# Patient Record
Sex: Female | Born: 1960 | Race: White | Hispanic: No | Marital: Single | Smoking: Former smoker
Health system: Southern US, Community
[De-identification: ages and names within clinical notes are randomized; demographics above are authoritative.]

## PROBLEM LIST (undated history)

## (undated) DIAGNOSIS — E119 Type 2 diabetes mellitus without complications: Secondary | ICD-10-CM

## (undated) DIAGNOSIS — G4730 Sleep apnea, unspecified: Secondary | ICD-10-CM

## (undated) DIAGNOSIS — T7840XA Allergy, unspecified, initial encounter: Secondary | ICD-10-CM

## (undated) DIAGNOSIS — I10 Essential (primary) hypertension: Secondary | ICD-10-CM

## (undated) DIAGNOSIS — E079 Disorder of thyroid, unspecified: Secondary | ICD-10-CM

## (undated) DIAGNOSIS — M5020 Other cervical disc displacement, unspecified cervical region: Secondary | ICD-10-CM

## (undated) DIAGNOSIS — K769 Liver disease, unspecified: Secondary | ICD-10-CM

## (undated) HISTORY — PX: LIVER BIOPSY: SHX301

## (undated) HISTORY — PX: CHOLECYSTECTOMY: SHX55

## (undated) HISTORY — DX: Allergy, unspecified, initial encounter: T78.40XA

## (undated) HISTORY — PX: ABLATION: SHX5711

---

## 2005-02-17 ENCOUNTER — Emergency Department: Payer: Self-pay | Admitting: Unknown Physician Specialty

## 2006-08-15 ENCOUNTER — Emergency Department: Payer: Self-pay | Admitting: General Practice

## 2006-08-31 ENCOUNTER — Emergency Department: Payer: Self-pay | Admitting: Emergency Medicine

## 2006-09-02 ENCOUNTER — Emergency Department: Payer: Self-pay | Admitting: Unknown Physician Specialty

## 2007-06-06 ENCOUNTER — Emergency Department: Payer: Self-pay

## 2008-01-02 ENCOUNTER — Observation Stay: Payer: Self-pay | Admitting: Surgery

## 2008-01-02 ENCOUNTER — Other Ambulatory Visit: Payer: Self-pay

## 2009-03-12 ENCOUNTER — Emergency Department: Payer: Self-pay | Admitting: Internal Medicine

## 2011-03-28 ENCOUNTER — Emergency Department: Payer: Self-pay | Admitting: Emergency Medicine

## 2011-04-15 ENCOUNTER — Ambulatory Visit: Payer: Self-pay | Admitting: Family Medicine

## 2011-09-12 ENCOUNTER — Emergency Department: Payer: Self-pay | Admitting: Emergency Medicine

## 2011-09-12 LAB — CBC WITH DIFFERENTIAL/PLATELET
Basophil #: 0 10*3/uL (ref 0.0–0.1)
Eosinophil %: 3.4 %
HGB: 10.6 g/dL — ABNORMAL LOW (ref 12.0–16.0)
Lymphocyte #: 1.5 10*3/uL (ref 1.0–3.6)
Lymphocyte %: 29.5 %
MCH: 24 pg — ABNORMAL LOW (ref 26.0–34.0)
MCV: 76 fL — ABNORMAL LOW (ref 80–100)
Monocyte #: 0.4 x10 3/mm (ref 0.2–0.9)
Monocyte %: 7.8 %
Neutrophil %: 58.9 %
Platelet: 306 10*3/uL (ref 150–440)
WBC: 5 10*3/uL (ref 3.6–11.0)

## 2011-09-18 ENCOUNTER — Emergency Department: Payer: Self-pay | Admitting: Emergency Medicine

## 2011-11-17 LAB — HEPATIC FUNCTION PANEL A (ARMC)
Bilirubin, Direct: <0.1 mg/dL (ref 0.00–0.20)
SGOT(AST): 31 U/L (ref 15–37)
Total Protein: 7.8 g/dL (ref 6.4–8.2)

## 2011-11-17 LAB — BASIC METABOLIC PANEL
Calcium, Total: 9 mg/dL (ref 8.5–10.1)
Chloride: 106 mmol/L (ref 98–107)
EGFR (Non-African Amer.): >60
Glucose: 85 mg/dL (ref 65–99)
Osmolality: 281 (ref 275–301)
Potassium: 3.7 mmol/L (ref 3.5–5.1)
Sodium: 142 mmol/L (ref 136–145)

## 2011-11-17 LAB — CK TOTAL AND CKMB (NOT AT ARMC): CK, Total: 140 U/L (ref 21–215)

## 2011-11-17 LAB — CBC
HCT: 37.9 % (ref 35.0–47.0)
HGB: 12 g/dL (ref 12.0–16.0)
MCH: 25.1 pg — ABNORMAL LOW (ref 26.0–34.0)
MCHC: 31.7 g/dL — ABNORMAL LOW (ref 32.0–36.0)
Platelet: 262 10*3/uL (ref 150–440)
RBC: 4.79 10*6/uL (ref 3.80–5.20)
WBC: 5.9 10*3/uL (ref 3.6–11.0)

## 2011-11-18 ENCOUNTER — Observation Stay: Payer: Self-pay | Admitting: Internal Medicine

## 2011-11-18 DIAGNOSIS — R079 Chest pain, unspecified: Secondary | ICD-10-CM

## 2011-11-18 LAB — IRON AND TIBC
Iron Bind.Cap.(Total): 330 ug/dL (ref 250–450)
Iron Saturation: 29 %
Iron: 97 ug/dL (ref 50–170)

## 2011-11-18 LAB — FERRITIN: Ferritin (ARMC): 49 ng/mL (ref 8–388)

## 2011-11-18 LAB — TROPONIN I
Troponin-I: <0.02 ng/mL
Troponin-I: <0.02 ng/mL

## 2011-11-23 LAB — PATHOLOGY REPORT

## 2011-12-13 ENCOUNTER — Emergency Department: Payer: Self-pay | Admitting: Emergency Medicine

## 2012-01-09 ENCOUNTER — Emergency Department: Payer: Self-pay | Admitting: Internal Medicine

## 2012-01-09 LAB — CBC
HGB: 12.8 g/dL (ref 12.0–16.0)
Platelet: 250 10*3/uL (ref 150–440)
RBC: 4.91 10*6/uL (ref 3.80–5.20)
WBC: 5.7 10*3/uL (ref 3.6–11.0)

## 2012-01-09 LAB — COMPREHENSIVE METABOLIC PANEL
Alkaline Phosphatase: 186 U/L — ABNORMAL HIGH (ref 50–136)
Anion Gap: 5 — ABNORMAL LOW (ref 7–16)
BUN: 8 mg/dL (ref 7–18)
Calcium, Total: 9.1 mg/dL (ref 8.5–10.1)
Co2: 34 mmol/L — ABNORMAL HIGH (ref 21–32)
EGFR (African American): >60
EGFR (Non-African Amer.): >60
Osmolality: 279 (ref 275–301)
SGOT(AST): 93 U/L — ABNORMAL HIGH (ref 15–37)
SGPT (ALT): 188 U/L — ABNORMAL HIGH (ref 12–78)
Sodium: 141 mmol/L (ref 136–145)

## 2012-01-09 LAB — URINALYSIS, COMPLETE
Bilirubin,UR: NEGATIVE
Blood: NEGATIVE
Ketone: NEGATIVE
Nitrite: NEGATIVE
Ph: 9 (ref 4.5–8.0)
Specific Gravity: 1.014 (ref 1.003–1.030)
Squamous Epithelial: 2

## 2012-01-13 ENCOUNTER — Emergency Department: Payer: Self-pay | Admitting: Unknown Physician Specialty

## 2012-01-13 LAB — CBC
HGB: 12.5 g/dL (ref 12.0–16.0)
MCHC: 32.1 g/dL (ref 32.0–36.0)
Platelet: 310 10*3/uL (ref 150–440)
RBC: 4.8 10*6/uL (ref 3.80–5.20)
WBC: 11.5 10*3/uL — ABNORMAL HIGH (ref 3.6–11.0)

## 2012-01-13 LAB — MAGNESIUM: Magnesium: 2.1 mg/dL

## 2012-01-13 LAB — COMPREHENSIVE METABOLIC PANEL
Alkaline Phosphatase: 147 U/L — ABNORMAL HIGH (ref 50–136)
Anion Gap: 8 (ref 7–16)
BUN: 19 mg/dL — ABNORMAL HIGH (ref 7–18)
Bilirubin,Total: 0.3 mg/dL (ref 0.2–1.0)
Chloride: 103 mmol/L (ref 98–107)
EGFR (African American): >60
Osmolality: 285 (ref 275–301)
Potassium: 3.5 mmol/L (ref 3.5–5.1)
SGOT(AST): 20 U/L (ref 15–37)
Sodium: 141 mmol/L (ref 136–145)
Total Protein: 7.4 g/dL (ref 6.4–8.2)

## 2012-01-13 LAB — URINALYSIS, COMPLETE
Bacteria: NONE SEEN
Bilirubin,UR: NEGATIVE
Blood: NEGATIVE
Glucose,UR: NEGATIVE mg/dL (ref 0–75)
Leukocyte Esterase: NEGATIVE
Nitrite: NEGATIVE
WBC UR: <1 /HPF (ref 0–5)

## 2012-01-13 LAB — SEDIMENTATION RATE: Erythrocyte Sed Rate: 6 mm/hr (ref 0–30)

## 2012-01-14 LAB — CULTURE, BLOOD (SINGLE)

## 2012-05-13 ENCOUNTER — Emergency Department: Payer: Self-pay | Admitting: Emergency Medicine

## 2012-07-09 ENCOUNTER — Emergency Department: Payer: Self-pay | Admitting: Internal Medicine

## 2012-07-09 LAB — COMPREHENSIVE METABOLIC PANEL WITH GFR
Albumin: 4 g/dL (ref 3.4–5.0)
Alkaline Phosphatase: 147 U/L — ABNORMAL HIGH (ref 50–136)
Anion Gap: 8 (ref 7–16)
BUN: 12 mg/dL (ref 7–18)
Bilirubin,Total: 0.5 mg/dL (ref 0.2–1.0)
Calcium, Total: 8.5 mg/dL (ref 8.5–10.1)
Chloride: 111 mmol/L — ABNORMAL HIGH (ref 98–107)
Co2: 23 mmol/L (ref 21–32)
Creatinine: 0.67 mg/dL (ref 0.60–1.30)
EGFR (African American): >60
EGFR (Non-African Amer.): >60
Glucose: 95 mg/dL (ref 65–99)
Osmolality: 283 (ref 275–301)
Potassium: 3.3 mmol/L — ABNORMAL LOW (ref 3.5–5.1)
SGOT(AST): 31 U/L (ref 15–37)
SGPT (ALT): 38 U/L (ref 12–78)
Sodium: 142 mmol/L (ref 136–145)
Total Protein: 7.2 g/dL (ref 6.4–8.2)

## 2012-07-09 LAB — CBC
HCT: 39.7 % (ref 35.0–47.0)
HGB: 13.1 g/dL (ref 12.0–16.0)
MCH: 25.8 pg — ABNORMAL LOW (ref 26.0–34.0)
RDW: 14.3 % (ref 11.5–14.5)
WBC: 6.7 10*3/uL (ref 3.6–11.0)

## 2012-10-21 ENCOUNTER — Emergency Department: Payer: Self-pay | Admitting: Emergency Medicine

## 2012-12-18 ENCOUNTER — Emergency Department: Payer: Self-pay | Admitting: Emergency Medicine

## 2013-01-11 ENCOUNTER — Emergency Department: Payer: Self-pay | Admitting: Emergency Medicine

## 2013-01-11 LAB — COMPREHENSIVE METABOLIC PANEL
Albumin: 3.9 g/dL (ref 3.4–5.0)
Anion Gap: 6 — ABNORMAL LOW (ref 7–16)
BUN: 14 mg/dL (ref 7–18)
Calcium, Total: 8.8 mg/dL (ref 8.5–10.1)
Chloride: 105 mmol/L (ref 98–107)
Co2: 29 mmol/L (ref 21–32)
Creatinine: 0.68 mg/dL (ref 0.60–1.30)
EGFR (African American): >60
Osmolality: 279 (ref 275–301)
Potassium: 3.1 mmol/L — ABNORMAL LOW (ref 3.5–5.1)
SGOT(AST): 38 U/L — ABNORMAL HIGH (ref 15–37)
SGPT (ALT): 57 U/L (ref 12–78)
Total Protein: 6.8 g/dL (ref 6.4–8.2)

## 2013-01-11 LAB — CBC
HGB: 12.1 g/dL (ref 12.0–16.0)
MCV: 79 fL — ABNORMAL LOW (ref 80–100)
Platelet: 259 10*3/uL (ref 150–440)
RBC: 4.66 10*6/uL (ref 3.80–5.20)

## 2013-01-11 LAB — CK TOTAL AND CKMB (NOT AT ARMC): CK, Total: 257 U/L — ABNORMAL HIGH (ref 21–215)

## 2013-01-18 ENCOUNTER — Emergency Department: Payer: Self-pay | Admitting: Internal Medicine

## 2013-01-18 LAB — COMPREHENSIVE METABOLIC PANEL
Albumin: 3.9 g/dL (ref 3.4–5.0)
BUN: 10 mg/dL (ref 7–18)
Calcium, Total: 8.9 mg/dL (ref 8.5–10.1)
Creatinine: 0.72 mg/dL (ref 0.60–1.30)
EGFR (African American): >60
EGFR (Non-African Amer.): >60
Glucose: 132 mg/dL — ABNORMAL HIGH (ref 65–99)
Potassium: 3.3 mmol/L — ABNORMAL LOW (ref 3.5–5.1)
Total Protein: 7.5 g/dL (ref 6.4–8.2)

## 2013-01-18 LAB — CBC
MCH: 26.3 pg (ref 26.0–34.0)
MCHC: 33.3 g/dL (ref 32.0–36.0)
MCV: 79 fL — ABNORMAL LOW (ref 80–100)
Platelet: 306 10*3/uL (ref 150–440)
WBC: 6.4 10*3/uL (ref 3.6–11.0)

## 2013-01-18 LAB — DRUG SCREEN, URINE

## 2013-03-01 ENCOUNTER — Emergency Department: Payer: Self-pay | Admitting: Emergency Medicine

## 2013-03-01 LAB — URINALYSIS, COMPLETE
Bacteria: NONE SEEN
Glucose,UR: NEGATIVE mg/dL (ref 0–75)
Ketone: NEGATIVE
Nitrite: NEGATIVE
Ph: 6 (ref 4.5–8.0)
WBC UR: 2 /HPF (ref 0–5)

## 2013-03-01 LAB — COMPREHENSIVE METABOLIC PANEL
Alkaline Phosphatase: 119 U/L (ref 50–136)
BUN: 6 mg/dL — ABNORMAL LOW (ref 7–18)
Calcium, Total: 8.9 mg/dL (ref 8.5–10.1)
Chloride: 109 mmol/L — ABNORMAL HIGH (ref 98–107)
Creatinine: 0.63 mg/dL (ref 0.60–1.30)
EGFR (African American): >60
EGFR (Non-African Amer.): >60
Glucose: 97 mg/dL (ref 65–99)
Osmolality: 283 (ref 275–301)
Potassium: 3.3 mmol/L — ABNORMAL LOW (ref 3.5–5.1)
SGPT (ALT): 33 U/L (ref 12–78)
Sodium: 143 mmol/L (ref 136–145)
Total Protein: 7.5 g/dL (ref 6.4–8.2)

## 2013-03-01 LAB — DRUG SCREEN, URINE
Barbiturates, Ur Screen: NEGATIVE (ref ?–200)
Benzodiazepine, Ur Scrn: POSITIVE (ref ?–200)
Cannabinoid 50 Ng, Ur ~~LOC~~: NEGATIVE (ref ?–50)
Cocaine Metabolite,Ur ~~LOC~~: NEGATIVE (ref ?–300)
MDMA (Ecstasy)Ur Screen: NEGATIVE (ref ?–500)
Methadone, Ur Screen: NEGATIVE (ref ?–300)
Opiate, Ur Screen: NEGATIVE (ref ?–300)
Phencyclidine (PCP) Ur S: NEGATIVE (ref ?–25)
Tricyclic, Ur Screen: NEGATIVE (ref ?–1000)

## 2013-03-01 LAB — ETHANOL: Ethanol: 45 mg/dL

## 2013-03-01 LAB — CBC
HCT: 40.4 % (ref 35.0–47.0)
HGB: 13.4 g/dL (ref 12.0–16.0)
Platelet: 316 10*3/uL (ref 150–440)
RBC: 5.14 10*6/uL (ref 3.80–5.20)
RDW: 14.2 % (ref 11.5–14.5)
WBC: 7.3 10*3/uL (ref 3.6–11.0)

## 2013-03-06 ENCOUNTER — Emergency Department: Payer: Self-pay | Admitting: Emergency Medicine

## 2013-03-06 LAB — URINALYSIS, COMPLETE
Bacteria: NONE SEEN
Bilirubin,UR: NEGATIVE
Glucose,UR: NEGATIVE mg/dL (ref 0–75)
Ketone: NEGATIVE
Nitrite: NEGATIVE
Ph: 7 (ref 4.5–8.0)
Protein: NEGATIVE
RBC,UR: 2 /HPF (ref 0–5)
Specific Gravity: 1.009 (ref 1.003–1.030)
Squamous Epithelial: 2

## 2013-03-08 LAB — URINE CULTURE

## 2013-03-12 ENCOUNTER — Emergency Department: Payer: Self-pay | Admitting: Emergency Medicine

## 2013-03-12 LAB — CBC WITH DIFFERENTIAL/PLATELET
Basophil #: 0.1 10*3/uL (ref 0.0–0.1)
Basophil %: 1 %
Eosinophil #: 0.3 10*3/uL (ref 0.0–0.7)
Lymphocyte #: 2.5 10*3/uL (ref 1.0–3.6)
Lymphocyte %: 29.8 %
MCV: 79 fL — ABNORMAL LOW (ref 80–100)
Monocyte #: 0.7 x10 3/mm (ref 0.2–0.9)
Neutrophil #: 4.8 10*3/uL (ref 1.4–6.5)
RBC: 4.57 10*6/uL (ref 3.80–5.20)
WBC: 8.4 10*3/uL (ref 3.6–11.0)

## 2013-03-12 LAB — BASIC METABOLIC PANEL
Anion Gap: 10 (ref 7–16)
BUN: 9 mg/dL (ref 7–18)
Calcium, Total: 8.8 mg/dL (ref 8.5–10.1)
Co2: 25 mmol/L (ref 21–32)
Creatinine: 0.71 mg/dL (ref 0.60–1.30)
EGFR (African American): >60
EGFR (Non-African Amer.): >60
Potassium: 3.7 mmol/L (ref 3.5–5.1)

## 2013-03-20 ENCOUNTER — Emergency Department: Payer: Self-pay | Admitting: Emergency Medicine

## 2013-03-20 LAB — URINALYSIS, COMPLETE
Bilirubin,UR: NEGATIVE
Blood: NEGATIVE
Ketone: NEGATIVE
Ph: 6 (ref 4.5–8.0)
Protein: NEGATIVE
Specific Gravity: 1.003 (ref 1.003–1.030)
Squamous Epithelial: 3
WBC UR: <1 /HPF (ref 0–5)

## 2013-03-20 LAB — GC/CHLAMYDIA PROBE AMP

## 2013-03-23 ENCOUNTER — Emergency Department: Payer: Self-pay | Admitting: Internal Medicine

## 2013-04-08 ENCOUNTER — Emergency Department: Payer: Self-pay | Admitting: Emergency Medicine

## 2013-04-18 ENCOUNTER — Emergency Department: Payer: Self-pay | Admitting: Emergency Medicine

## 2013-06-13 ENCOUNTER — Emergency Department: Payer: Self-pay | Admitting: Emergency Medicine

## 2013-06-13 LAB — URINALYSIS, COMPLETE
BILIRUBIN, UR: NEGATIVE
Bacteria: NONE SEEN
Blood: NEGATIVE
GLUCOSE, UR: NEGATIVE mg/dL (ref 0–75)
KETONE: NEGATIVE
Leukocyte Esterase: NEGATIVE
NITRITE: NEGATIVE
Ph: 7 (ref 4.5–8.0)
Protein: NEGATIVE
Specific Gravity: 1.019 (ref 1.003–1.030)
Squamous Epithelial: 8
WBC UR: <1 /HPF (ref 0–5)

## 2013-06-13 LAB — RAPID INFLUENZA A&B ANTIGENS (ARMC ONLY)

## 2013-06-22 ENCOUNTER — Emergency Department: Payer: Self-pay | Admitting: Emergency Medicine

## 2013-07-04 ENCOUNTER — Ambulatory Visit: Payer: Self-pay | Admitting: Pain Medicine

## 2013-07-22 ENCOUNTER — Emergency Department: Payer: Self-pay | Admitting: Internal Medicine

## 2013-07-23 ENCOUNTER — Emergency Department: Payer: Self-pay | Admitting: Emergency Medicine

## 2013-08-02 ENCOUNTER — Ambulatory Visit: Payer: Self-pay | Admitting: Pain Medicine

## 2013-08-17 ENCOUNTER — Ambulatory Visit: Payer: Self-pay | Admitting: Pain Medicine

## 2013-08-30 ENCOUNTER — Ambulatory Visit: Payer: Self-pay | Admitting: Pain Medicine

## 2013-12-12 ENCOUNTER — Ambulatory Visit: Payer: Self-pay | Admitting: Pain Medicine

## 2013-12-25 ENCOUNTER — Ambulatory Visit: Payer: Self-pay | Admitting: Pain Medicine

## 2013-12-26 ENCOUNTER — Emergency Department: Payer: Self-pay | Admitting: Emergency Medicine

## 2013-12-26 LAB — URINALYSIS, COMPLETE
BACTERIA: NONE SEEN
Bilirubin,UR: NEGATIVE
GLUCOSE, UR: NEGATIVE mg/dL (ref 0–75)
Hyaline Cast: 3
Ketone: NEGATIVE
LEUKOCYTE ESTERASE: NEGATIVE
NITRITE: NEGATIVE
PH: 6 (ref 4.5–8.0)
Protein: NEGATIVE
RBC,UR: 4 /HPF (ref 0–5)
Specific Gravity: 1.013 (ref 1.003–1.030)
Squamous Epithelial: 3
WBC UR: <1 /HPF (ref 0–5)

## 2013-12-26 LAB — DRUG SCREEN, URINE
Amphetamines, Ur Screen: NEGATIVE (ref ?–1000)
BENZODIAZEPINE, UR SCRN: POSITIVE (ref ?–200)
Barbiturates, Ur Screen: NEGATIVE (ref ?–200)
Cannabinoid 50 Ng, Ur ~~LOC~~: NEGATIVE (ref ?–50)
Cocaine Metabolite,Ur ~~LOC~~: NEGATIVE (ref ?–300)
MDMA (Ecstasy)Ur Screen: NEGATIVE (ref ?–500)
Methadone, Ur Screen: NEGATIVE (ref ?–300)
OPIATE, UR SCREEN: NEGATIVE (ref ?–300)
Phencyclidine (PCP) Ur S: NEGATIVE (ref ?–25)
Tricyclic, Ur Screen: POSITIVE (ref ?–1000)

## 2013-12-26 LAB — COMPREHENSIVE METABOLIC PANEL
ALBUMIN: 4.1 g/dL (ref 3.4–5.0)
Alkaline Phosphatase: 169 U/L — ABNORMAL HIGH
Anion Gap: 7 (ref 7–16)
BUN: 11 mg/dL (ref 7–18)
Bilirubin,Total: 0.4 mg/dL (ref 0.2–1.0)
CO2: 28 mmol/L (ref 21–32)
Calcium, Total: 8.7 mg/dL (ref 8.5–10.1)
Chloride: 104 mmol/L (ref 98–107)
Creatinine: 0.75 mg/dL (ref 0.60–1.30)
EGFR (African American): >60
Glucose: 97 mg/dL (ref 65–99)
Osmolality: 277 (ref 275–301)
POTASSIUM: 3.6 mmol/L (ref 3.5–5.1)
SGOT(AST): 50 U/L — ABNORMAL HIGH (ref 15–37)
SGPT (ALT): 113 U/L — ABNORMAL HIGH
Sodium: 139 mmol/L (ref 136–145)
Total Protein: 8.2 g/dL (ref 6.4–8.2)

## 2013-12-26 LAB — CBC
HCT: 41.3 % (ref 35.0–47.0)
HGB: 13.1 g/dL (ref 12.0–16.0)
MCH: 25.9 pg — ABNORMAL LOW (ref 26.0–34.0)
MCHC: 31.6 g/dL — AB (ref 32.0–36.0)
MCV: 82 fL (ref 80–100)
Platelet: 392 10*3/uL (ref 150–440)
RBC: 5.05 10*6/uL (ref 3.80–5.20)
RDW: 14.5 % (ref 11.5–14.5)
WBC: 11.7 10*3/uL — AB (ref 3.6–11.0)

## 2013-12-26 LAB — ETHANOL
Ethanol %: 0.032 % (ref 0.000–0.080)
Ethanol: 32 mg/dL

## 2013-12-26 LAB — ACETAMINOPHEN LEVEL: Acetaminophen: <2 ug/mL

## 2013-12-26 LAB — SALICYLATE LEVEL: SALICYLATES, SERUM: 3.2 mg/dL — AB

## 2014-01-01 ENCOUNTER — Ambulatory Visit: Payer: Self-pay | Admitting: Pain Medicine

## 2014-01-15 ENCOUNTER — Ambulatory Visit: Payer: Self-pay | Admitting: Pain Medicine

## 2014-03-01 ENCOUNTER — Ambulatory Visit: Payer: Self-pay | Admitting: Pain Medicine

## 2014-03-11 ENCOUNTER — Emergency Department: Payer: Self-pay | Admitting: Emergency Medicine

## 2014-03-11 LAB — CBC
HCT: 39.1 % (ref 35.0–47.0)
HGB: 12.2 g/dL (ref 12.0–16.0)
MCH: 25.2 pg — ABNORMAL LOW (ref 26.0–34.0)
MCHC: 31.1 g/dL — ABNORMAL LOW (ref 32.0–36.0)
MCV: 81 fL (ref 80–100)
PLATELETS: 258 10*3/uL (ref 150–440)
RBC: 4.82 10*6/uL (ref 3.80–5.20)
RDW: 14.7 % — ABNORMAL HIGH (ref 11.5–14.5)
WBC: 7.6 10*3/uL (ref 3.6–11.0)

## 2014-03-11 LAB — URINALYSIS, COMPLETE
BACTERIA: NONE SEEN
BILIRUBIN, UR: NEGATIVE
BLOOD: NEGATIVE
Glucose,UR: NEGATIVE mg/dL (ref 0–75)
KETONE: NEGATIVE
Nitrite: NEGATIVE
Ph: 6 (ref 4.5–8.0)
RBC,UR: 7 /HPF (ref 0–5)
Specific Gravity: 1.019 (ref 1.003–1.030)

## 2014-03-11 LAB — BASIC METABOLIC PANEL
Anion Gap: 3 — ABNORMAL LOW (ref 7–16)
BUN: 11 mg/dL (ref 7–18)
Calcium, Total: 7.9 mg/dL — ABNORMAL LOW (ref 8.5–10.1)
Chloride: 108 mmol/L — ABNORMAL HIGH (ref 98–107)
Co2: 29 mmol/L (ref 21–32)
Creatinine: 0.64 mg/dL (ref 0.60–1.30)
Glucose: 70 mg/dL (ref 65–99)
OSMOLALITY: 277 (ref 275–301)
Potassium: 3.1 mmol/L — ABNORMAL LOW (ref 3.5–5.1)
Sodium: 140 mmol/L (ref 136–145)

## 2014-03-13 ENCOUNTER — Emergency Department: Payer: Self-pay | Admitting: Emergency Medicine

## 2014-03-13 LAB — CBC
HCT: 39.1 % (ref 35.0–47.0)
HGB: 12.4 g/dL (ref 12.0–16.0)
MCH: 25.9 pg — AB (ref 26.0–34.0)
MCHC: 31.7 g/dL — ABNORMAL LOW (ref 32.0–36.0)
MCV: 82 fL (ref 80–100)
Platelet: 275 10*3/uL (ref 150–440)
RBC: 4.79 10*6/uL (ref 3.80–5.20)
RDW: 14.7 % — AB (ref 11.5–14.5)
WBC: 6.4 10*3/uL (ref 3.6–11.0)

## 2014-03-13 LAB — BASIC METABOLIC PANEL
ANION GAP: 5 — AB (ref 7–16)
BUN: 8 mg/dL (ref 7–18)
Calcium, Total: 8.5 mg/dL (ref 8.5–10.1)
Chloride: 105 mmol/L (ref 98–107)
Co2: 28 mmol/L (ref 21–32)
Creatinine: 0.95 mg/dL (ref 0.60–1.30)
EGFR (African American): >60
EGFR (Non-African Amer.): >60
GLUCOSE: 60 mg/dL — AB (ref 65–99)
Osmolality: 272 (ref 275–301)
Potassium: 3.3 mmol/L — ABNORMAL LOW (ref 3.5–5.1)
Sodium: 138 mmol/L (ref 136–145)

## 2014-03-24 ENCOUNTER — Emergency Department: Payer: Self-pay | Admitting: Emergency Medicine

## 2014-03-24 LAB — CBC WITH DIFFERENTIAL/PLATELET
Basophil #: 0 10*3/uL (ref 0.0–0.1)
Basophil %: 0.7 %
EOS PCT: 2.1 %
Eosinophil #: 0.2 10*3/uL (ref 0.0–0.7)
HCT: 40.5 % (ref 35.0–47.0)
HGB: 12.6 g/dL (ref 12.0–16.0)
LYMPHS PCT: 25.9 %
Lymphocyte #: 1.9 10*3/uL (ref 1.0–3.6)
MCH: 25.2 pg — ABNORMAL LOW (ref 26.0–34.0)
MCHC: 31 g/dL — ABNORMAL LOW (ref 32.0–36.0)
MCV: 81 fL (ref 80–100)
Monocyte #: 0.6 x10 3/mm (ref 0.2–0.9)
Monocyte %: 8.2 %
Neutrophil #: 4.5 10*3/uL (ref 1.4–6.5)
Neutrophil %: 63.1 %
PLATELETS: 327 10*3/uL (ref 150–440)
RBC: 5 10*6/uL (ref 3.80–5.20)
RDW: 14.7 % — AB (ref 11.5–14.5)
WBC: 7.1 10*3/uL (ref 3.6–11.0)

## 2014-03-24 LAB — COMPREHENSIVE METABOLIC PANEL
ALBUMIN: 3.1 g/dL — AB (ref 3.4–5.0)
ALK PHOS: 84 U/L
ALT: 23 U/L
Anion Gap: 10 (ref 7–16)
BUN: 17 mg/dL (ref 7–18)
Bilirubin,Total: 0.3 mg/dL (ref 0.2–1.0)
CHLORIDE: 107 mmol/L (ref 98–107)
Calcium, Total: 8 mg/dL — ABNORMAL LOW (ref 8.5–10.1)
Co2: 28 mmol/L (ref 21–32)
Creatinine: 0.64 mg/dL (ref 0.60–1.30)
EGFR (Non-African Amer.): >60
Glucose: 137 mg/dL — ABNORMAL HIGH (ref 65–99)
Osmolality: 292 (ref 275–301)
POTASSIUM: 3.2 mmol/L — AB (ref 3.5–5.1)
SGOT(AST): 20 U/L (ref 15–37)
Sodium: 145 mmol/L (ref 136–145)
Total Protein: 5.7 g/dL — ABNORMAL LOW (ref 6.4–8.2)

## 2014-03-24 LAB — URINALYSIS, COMPLETE
BACTERIA: NONE SEEN
BILIRUBIN, UR: NEGATIVE
BLOOD: NEGATIVE
Glucose,UR: NEGATIVE mg/dL (ref 0–75)
Ketone: NEGATIVE
Leukocyte Esterase: NEGATIVE
Nitrite: NEGATIVE
PROTEIN: NEGATIVE
Ph: 6 (ref 4.5–8.0)
RBC,UR: 4 /HPF (ref 0–5)
Specific Gravity: 1.018 (ref 1.003–1.030)
Squamous Epithelial: 1
WBC UR: 3 /HPF (ref 0–5)

## 2014-03-24 LAB — TROPONIN I: Troponin-I: <0.02 ng/mL

## 2014-03-24 LAB — LIPASE, BLOOD: Lipase: 163 U/L (ref 73–393)

## 2014-03-25 LAB — URINE CULTURE

## 2014-03-28 ENCOUNTER — Ambulatory Visit: Payer: Self-pay | Admitting: Pain Medicine

## 2014-04-28 ENCOUNTER — Emergency Department: Payer: Self-pay | Admitting: Emergency Medicine

## 2014-04-28 LAB — COMPREHENSIVE METABOLIC PANEL
ALT: 35 U/L
ANION GAP: 5 — AB (ref 7–16)
Albumin: 3.6 g/dL (ref 3.4–5.0)
Alkaline Phosphatase: 102 U/L
BUN: 7 mg/dL (ref 7–18)
Bilirubin,Total: 0.3 mg/dL (ref 0.2–1.0)
Calcium, Total: 8.5 mg/dL (ref 8.5–10.1)
Chloride: 109 mmol/L — ABNORMAL HIGH (ref 98–107)
Co2: 27 mmol/L (ref 21–32)
Creatinine: 0.79 mg/dL (ref 0.60–1.30)
Glucose: 107 mg/dL — ABNORMAL HIGH (ref 65–99)
Osmolality: 280 (ref 275–301)
Potassium: 3.6 mmol/L (ref 3.5–5.1)
SGOT(AST): 18 U/L (ref 15–37)
Sodium: 141 mmol/L (ref 136–145)
Total Protein: 6.9 g/dL (ref 6.4–8.2)

## 2014-04-28 LAB — CBC
HCT: 43.7 % (ref 35.0–47.0)
HGB: 14 g/dL (ref 12.0–16.0)
MCH: 26.1 pg (ref 26.0–34.0)
MCHC: 32.1 g/dL (ref 32.0–36.0)
MCV: 81 fL (ref 80–100)
Platelet: 307 10*3/uL (ref 150–440)
RBC: 5.37 10*6/uL — ABNORMAL HIGH (ref 3.80–5.20)
RDW: 15.3 % — ABNORMAL HIGH (ref 11.5–14.5)
WBC: 7.2 10*3/uL (ref 3.6–11.0)

## 2014-04-28 LAB — URINALYSIS, COMPLETE
BILIRUBIN, UR: NEGATIVE
Bacteria: NONE SEEN
GLUCOSE, UR: NEGATIVE mg/dL (ref 0–75)
Ketone: NEGATIVE
Leukocyte Esterase: NEGATIVE
Nitrite: NEGATIVE
PH: 5 (ref 4.5–8.0)
PROTEIN: NEGATIVE
RBC,UR: 4 /HPF (ref 0–5)
SPECIFIC GRAVITY: 1.021 (ref 1.003–1.030)
WBC UR: 1 /HPF (ref 0–5)

## 2014-04-28 LAB — LIPASE, BLOOD: Lipase: 131 U/L (ref 73–393)

## 2014-05-16 ENCOUNTER — Ambulatory Visit: Payer: Self-pay | Admitting: Pain Medicine

## 2014-06-08 ENCOUNTER — Emergency Department: Payer: Self-pay | Admitting: Emergency Medicine

## 2014-06-08 LAB — URINALYSIS, COMPLETE
BACTERIA: NONE SEEN
BILIRUBIN, UR: NEGATIVE
GLUCOSE, UR: NEGATIVE mg/dL (ref 0–75)
Ketone: NEGATIVE
LEUKOCYTE ESTERASE: NEGATIVE
Nitrite: NEGATIVE
Ph: 6 (ref 4.5–8.0)
Protein: NEGATIVE
RBC,UR: 4 /HPF (ref 0–5)
SPECIFIC GRAVITY: 1.014 (ref 1.003–1.030)
WBC UR: 1 /HPF (ref 0–5)

## 2014-07-28 ENCOUNTER — Emergency Department: Payer: Self-pay | Admitting: Emergency Medicine

## 2014-08-15 ENCOUNTER — Emergency Department: Payer: Self-pay | Admitting: Internal Medicine
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
## 2014-09-23 NOTE — H&P (Signed)
PATIENT NAME:  Christy Mcbride, Christy Mcbride MR#:  [id number] DATE OF BIRTH:  1960/12/14  DATE OF ADMISSION:  11/18/2011  PRIMARY CARE PHYSICIAN: Dr. Darreld Mclean from [facility]  CHIEF COMPLAINT: Chest pain, then developed palpitations.   HISTORY OF PRESENT ILLNESS: Christy Mcbride is a 54 year old Caucasian female with history of panic attacks and anxiety. She reports that on 11/13/2011 she had chest pain located at the midsternal area at the lower part. At that time she went to [facility] for evaluation. They did an EKG which was normal. However, today she had palpitations and near syncope feeling along with indigestion feeling. She went back to [facility]. An EKG was repeated and that showed some changes and patient was referred to the Emergency Department for admission and to consider stress test. I reviewed the EKGs that were done a few days ago and today. The change is the P wave axis has changed probably secondary to ectopic atrial return. No other significant changes. However, there was a concern whether she had some kind of arrhythmia or tachycardia due to complaint of palpitations and near syncope. Patient was admitted for observation on telemetry to monitor for any arrhythmias and to follow up on cardiac enzymes. Patient reports that about two weeks ago she had liver biopsy and that showed only some scar tissue but no other significant finding. She was advised to quit alcohol completely.   REVIEW OF SYSTEMS: CONSTITUTIONAL: Denies any fever. No chills. No night sweats. No fatigue. EYES: No blurring of vision. No double vision. ENT: No hearing impairment. No sore throat. No dysphagia. CARDIOVASCULAR: Reports chest pain a few days ago but no recurrence, however, she had some palpitations the last 24 hours and near syncope feeling. RESPIRATORY: No cough. No sputum production. No shortness of breath. GASTROINTESTINAL: She has some indigestion feeling. No nausea, no vomiting, no diarrhea. GENITOURINARY: No dysuria.  No frequency of urination. MUSCULOSKELETAL: No joint swelling or pain. No muscular pain or swelling. INTEGUMENTARY: No skin rash. No ulcers. NEUROLOGY: No focal weakness. No seizure activity. No headache. PSYCHIATRY: She has anxiety and panic attacks. No depression. ENDOCRINE: No polyuria or polydipsia. No heat or cold intolerance.   PAST MEDICAL HISTORY:  1. Panic attacks. 2. Sciatica.    PAST SURGICAL HISTORY:  1. She had liver biopsy two weeks ago reported to her that she has small scar tissue.  2. History of cholecystectomy.   FAMILY HISTORY: Positive for liver cirrhosis. Her father and her brother both died from complications of liver cirrhosis.   SOCIAL HABITS: Nonsmoker. She used to drink alcohol occasionally but now she stopped completely after the liver biopsy.   SOCIAL HISTORY: She is separated from her husband. She works at a Systems [employer].   ADMISSION MEDICATIONS:  1. Lyrica 150 mg t.i.d.  2. Paxil 40 mg a day. 3. Ibuprofen 600 mg p.r.n.  4. Tylenol p.r.n.  5. Cyclobenzaprine or Flexeril 10 mg 3 times a day p.r.n.  6. Clonazepam 0.5 mg twice a day p.r.n. for anxiety.   ALLERGIES: Codeine causing nausea and sulfa causing throat swelling.   PHYSICAL EXAMINATION:  VITAL SIGNS: Blood pressure 113/76, respiratory rate 18, pulse 80, temperature 96.3, oxygen saturation 97%.   GENERAL APPEARANCE: Middle-aged female laying in bed in no acute distress.   HEAD AND NECK EXAMINATION: No pallor. No icterus. No cyanosis.   ENT: Hearing was normal. Nasal mucosa, lips, tongue were normal.   EYES: Normal eyelids and conjunctiva. Pupils about 10 mm, equal and reactive to  light.   NECK: Supple. Trachea at midline. No thyromegaly. No cervical lymphadenopathy. No masses.   HEART: Normal S1, S2. No S3, S4. No murmur. No gallop. No carotid bruits.   RESPIRATORY: Normal breathing pattern without use of accessory muscles. No rales. No wheezing.   ABDOMEN: Soft  without tenderness. No hepatosplenomegaly. No masses. No hernias.   SKIN: No ulcers. No subcutaneous nodules.   MUSCULOSKELETAL: No joint swelling. No clubbing.   NEUROLOGIC: Cranial nerves II through XII are intact. No focal motor deficit.   PSYCHIATRY: Patient is alert, oriented x3. Mood and affect were normal.   LABORATORY, DIAGNOSTIC, AND RADIOLOGICAL DATA: EKG done on 11/13/2011 showed normal sinus rhythm at rate of 96 per minute, unremarkable EKG. The EKG of today showed inverted P wave in 1, aVL, V1, V2, V3 otherwise no other EKG abnormalities. Serum glucose 85, BUN 9, creatinine 0.7, sodium 142, potassium 3.7. Normal liver function tests. Total CPK 140. Troponin less than 0.02. CBC showed white count 5000, hemoglobin 12, hematocrit 37, platelet count 262. Chest x-ray showed heart size is slightly enlarged. No consolidation, no effusion.   ASSESSMENT:  1. Chest pain that was a few days ago but no recurrence. 2. Palpitations in the last 24 hours.  3. Near syncope. 4. Mild EKG changes as above.  5. Panic attacks.  6. Sciatica.  PLAN: Will admit the patient for observation. Follow up on cardiac enzymes. Telemetry monitoring for any arrhythmias. I scheduled the patient for a nuclear stress test. If there is recurrence of palpitation as an outpatient she may need Holter monitor or event monitor. But again, this can be done as an outpatient. I will place her on Zantac 150 mg twice a day to cover for the indigestion feeling and to cover for any GI component of her complaint. Will continue the rest of her home medications. Repeat EKG in the morning to see if there is any further changes.     TIME SPENT EVALUATING THIS PATIENT: Took more than 55 minutes.    ____________________________ Carney CornersAmir M. Rudene Rearwish, MD amd:cms D: 11/18/2011 02:33:36 ET T: 11/18/2011 09:32:45 ET JOB#: [id number]  cc: Carney CornersAmir M. Rudene Rearwish, MD, <Dictator> [name], MD Karolee OhsAMIR Dala DockM Eriyah Fernando MD ELECTRONICALLY SIGNED 11/18/2011  22:19

## 2014-09-23 NOTE — Discharge Summary (Signed)
PATIENT NAME:  Christy Mcbride, Christy Mcbride DATE OF BIRTH:  10/30/1960  DATE OF ADMISSION:  11/18/2011 DATE OF DISCHARGE:  11/19/2011  PRIMARY CARE PHYSICIAN: Dr. Darreld [facility]cleanLinda Miles at [facility]   DISCHARGE DIAGNOSES:  1. Chest pain and epigastric pain.  2. A small gastric ulcer seen on endoscopy.  3. Anxiety.  4. Constipation.  5. Migraine.  MEDICATIONS:  1. Note: I advised to stop taking Goody powder, advised to stop taking ibuprofen.  2. Continue Lyrica 150 mg t.i.d.  3. Continue cyclobenzaprine 10 mg as needed for leg spasms.  4. Continue Percocet 7.5/325, 1 tablet every 6 hours as needed for pain  5. Paxil 40 mg daily.  6. Clonazepam 0.5 mg b.i.d. as needed for anxiety. 7. Colace 100 mg b.i.d. 8. Lactulose 30 mL once a day as needed for constipation.  9. Omeprazole 20 mg p.o. daily.   ACTIVITY: As tolerated.   FOLLOWUP:  1. Follow up with Dr. Darreld McleanLinda Miles in 1 to 2 weeks.  2. Follow up with Dr. [name] in 4 weeks.   REASON FOR ADMISSION: The patient was admitted 11/18/2011 and discharged 11/19/2011. He came in with chest pain and palpitations and was admitted for chest pain, admitted as an observation. Stress test was ordered.   HOSPITAL LABORATORY, DIAGNOSTIC AND RADIOLOGICAL DATA:  Liver function tests were normal. Cardiac enzymes were negative.  Glucose 85, BUN 9, creatinine 0.75, sodium 142, potassium 3.7, chloride 106, CO2 30, calcium 9.0.  White blood cell count 5.9, hemoglobin and hematocrit 12.0 and 39.7, platelet count of 262. EKG: Normal sinus rhythm, left atrial enlargement.  Chest x-ray: No acute changes.  Stress test: No significant ischemia, ejection fraction 65%, low risk scan. Ferritin 49, serum 97, TIBC 330.  The patient also had an upper endoscopy that showed a small gastric ulcer with clean base, normal duodenum and normal esophagus. Endoscopy was done by Dr. [name] on 11/19/2011.   HOSPITAL COURSE PER PROBLEM LIST:  1. Chest pain and epigastric  pain: The patient had a negative stress test, ruled out by enzymes, and had an endoscopy that showed a small gastric ulcer. Omeprazole was started. The patient was advised to stop the [facility] powder and ibuprofen. 2. Anxiety: She is on numerous medications. 3. Chronic pain: She is on numerous medications. 4. Constipation: She was given Colace and lactulose as needed, especially if she is taking the pain medication.  5. History of migraine.  6. She was also complaining of some swelling of the lower extremities which I did not see, but I ordered some TED hose for her.  The patient was discharged home in stable condition. Followup is with Dr. Darreld McleanLinda Miles in 1 to 2 weeks. Marland Kitchen.   TIME SPENT ON DISCHARGE:  35 minutes.   ____________________________ Herschell Dimesichard J. Renae GlossWieting, MD rjw:cbb D: 11/19/2011 17:01:16 ET T: 11/20/2011 11:15:37 ET JOB#: [id number]  cc: Herschell Dimesichard J. Renae GlossWieting, MD, <Dictator> [name], MD Salley ScarletICHARD J Karson Chicas MD ELECTRONICALLY SIGNED 11/23/2011 12:12

## 2014-09-23 NOTE — Consult Note (Signed)
PATIENT NAME:  Christy Mcbride, Christy Mcbride MR#:  [id number] DATE OF BIRTH:  11/14/1960  DATE OF CONSULTATION:  11/18/2011  REFERRING PHYSICIAN:   CONSULTING PHYSICIAN:  Lurline Del, MD  REASON FOR CONSULTATION: Chest pain.   HISTORY OF PRESENT ILLNESS: 54 year old female with history of anxiety and panic attacks. Her panic attacks usually cause some chest tightness and fluttering of her heart. About 4 or 5 days ago she had an episode of mid sternal chest pain. She went to [facility] where an EKG was normal. Today patient had an episode of palpitation at work and she almost passed out. She went back to the [facility] where an EKG was reported to be somewhat abnormal. She came to the hospital and was subsequently admitted with a diagnosis of chest pain, rule out myocardial infarction. Apparently patient has just had a stress test which was reported normal by Dr. Mariah Milling. Patient's chest pain has resolved. Patient does have some history of indigestion and dyspepsia but it is infrequent and she usually takes over-the-counter stuff to control the symptoms. Patient also has history of alcohol use and has apparently underwent a liver biopsy a couple of weeks ago which probably showed some fatty liver and she was advised to stop alcohol. Currently she has no chest pain. Patient is also complaining of some constipation but this appears to be related to Lyrica that was started for her leg discomfort. Patient has not had an EGD or colonoscopy recently.   PAST MEDICAL HISTORY:  1. Sciatica.  2. Panic attacks. 3. Anxiety.    PAST SURGICAL HISTORY:  1. Liver biopsy recently. 2. History of cholecystectomy.   FAMILY HISTORY: Positive for liver cirrhosis.   SOCIAL HISTORY: She used to drink alcohol but she quit recently.   HOME MEDICATIONS:  1. Lyrica 150 mg t.i.d.  2. Paxil 40 mg a day. 3. Ibuprofen 600 mg p.r.n.  4. Tylenol p.r.n.  5. Cyclobenzaprine. 6. Clonazepam.   ALLERGIES: Codeine.   PHYSICAL  EXAMINATION:  GENERAL: Fairly well built female, does not appear to be in any acute distress, quite awake and alert.   HEENT: Unremarkable.   VITAL SIGNS: Temperature 98.1, pulse 89, respirations 18, blood pressure 133/81.   LUNGS: Grossly clear to auscultation bilaterally with fair air entry and no added sounds.   CARDIOVASCULAR: Regular rate and rhythm. No gallops or murmur.   ABDOMEN: Quite soft, mild epigastric tenderness was noted. There is no rebound or guarding. Bowel sounds are positive.   EXTREMITIES: No edema.   NEUROLOGIC: Examination appears to be unremarkable.   LABORATORY, DIAGNOSTIC, AND RADIOLOGICAL DATA: Chest x-ray unremarkable. White cell count 5.9, hemoglobin 12, hematocrit 37.9, platelet count 262. Troponin is less than 0.02. Liver enzymes are normal. Electrolytes, BUN, and creatinine normal.   ASSESSMENT AND PLAN: Patient with noncardiac chest pain most likely secondary to anxiety and panic attacks. Patient does take ibuprofen on a regular basis and does have some history of indigestion and therefore esophagitis, gastritis or peptic ulcer disease are also possible diagnoses. Since patient has not had an upper GI endoscopy I would recommend an upper GI endoscopy for further evaluation. This has been discussed with the patient and she is in full agreement. Patient is on PPI and will make further recommendations after an upper GI endoscopy. Patient will likely require a colonoscopy as well because of her age and constipation although this can be done as outpatient.   Will follow.   ____________________________ Lurline Del, MD si:cms D: 11/18/2011 17:31:40 ET T: 11/18/2011  17:39:47 ET JOB#: [id number]  cc: Lurline DelShaukat Lasonya Hubner, MD, <Dictator> Lurline DelSHAUKAT Caylan Schifano MD ELECTRONICALLY SIGNED 11/25/2011 13:07

## 2014-10-10 ENCOUNTER — Emergency Department (HOSPITAL_COMMUNITY)
Admission: EM | Admit: 2014-10-10 | Discharge: 2014-10-11 | Disposition: A | Discharge status: Home / Self Care | Payer: 59 | Attending: Emergency Medicine | Admitting: Emergency Medicine

## 2014-10-10 ENCOUNTER — Emergency Department (HOSPITAL_COMMUNITY): Payer: 59

## 2014-10-10 ENCOUNTER — Encounter (HOSPITAL_COMMUNITY): Payer: Self-pay | Admitting: Emergency Medicine

## 2014-10-10 DIAGNOSIS — R079 Chest pain, unspecified: Secondary | ICD-10-CM | POA: Diagnosis present

## 2014-10-10 DIAGNOSIS — K279 Peptic ulcer, site unspecified, unspecified as acute or chronic, without hemorrhage or perforation: Secondary | ICD-10-CM | POA: Insufficient documentation

## 2014-10-10 DIAGNOSIS — Z880 Allergy status to penicillin: Secondary | ICD-10-CM | POA: Diagnosis not present

## 2014-10-10 DIAGNOSIS — E119 Type 2 diabetes mellitus without complications: Secondary | ICD-10-CM | POA: Insufficient documentation

## 2014-10-10 HISTORY — DX: Type 2 diabetes mellitus without complications: E11.9

## 2014-10-10 LAB — CBC WITH DIFFERENTIAL/PLATELET
Basophils Absolute: 0 10*3/uL (ref 0.0–0.1)
Basophils Relative: 0 % (ref 0–1)
Eosinophils Absolute: 0.2 10*3/uL (ref 0.0–0.7)
Eosinophils Relative: 3 % (ref 0–5)
HCT: 35.9 % — ABNORMAL LOW (ref 36.0–46.0)
Hemoglobin: 11.4 g/dL — ABNORMAL LOW (ref 12.0–15.0)
Lymphocytes Relative: 31 % (ref 12–46)
Lymphs Abs: 2.5 10*3/uL (ref 0.7–4.0)
MCH: 25.6 pg — ABNORMAL LOW (ref 26.0–34.0)
MCHC: 31.8 g/dL (ref 30.0–36.0)
MCV: 80.5 fL (ref 78.0–100.0)
Monocytes Absolute: 0.7 10*3/uL (ref 0.1–1.0)
Monocytes Relative: 9 % (ref 3–12)
Neutro Abs: 4.7 10*3/uL (ref 1.7–7.7)
Neutrophils Relative %: 57 % (ref 43–77)
Platelets: 273 10*3/uL (ref 150–400)
RBC: 4.46 MIL/uL (ref 3.87–5.11)
RDW: 14.5 % (ref 11.5–15.5)
WBC: 8.1 10*3/uL (ref 4.0–10.5)

## 2014-10-10 LAB — I-STAT TROPONIN, ED: TROPONIN I, POC: 0 ng/mL (ref 0.00–0.08)

## 2014-10-10 LAB — COMPREHENSIVE METABOLIC PANEL
ALT: 54 U/L (ref 14–54)
AST: 42 U/L — ABNORMAL HIGH (ref 15–41)
Albumin: 2.9 g/dL — ABNORMAL LOW (ref 3.5–5.0)
Alkaline Phosphatase: 88 U/L (ref 38–126)
Anion gap: 8 (ref 5–15)
BUN: 9 mg/dL (ref 6–20)
CO2: 26 mmol/L (ref 22–32)
Calcium: 8.5 mg/dL — ABNORMAL LOW (ref 8.9–10.3)
Chloride: 107 mmol/L (ref 101–111)
Creatinine, Ser: 0.61 mg/dL (ref 0.44–1.00)
GFR calc Af Amer: >60 mL/min (ref >60)
GFR calc non Af Amer: >60 mL/min (ref >60)
Glucose, Bld: 150 mg/dL — ABNORMAL HIGH (ref 70–99)
Potassium: 3.4 mmol/L — ABNORMAL LOW (ref 3.5–5.1)
Sodium: 141 mmol/L (ref 135–145)
Total Bilirubin: 0.3 mg/dL (ref 0.3–1.2)
Total Protein: 5.3 g/dL — ABNORMAL LOW (ref 6.5–8.1)

## 2014-10-10 LAB — LIPASE, BLOOD: Lipase: 20 U/L — ABNORMAL LOW (ref 22–51)

## 2014-10-10 MED ORDER — GI COCKTAIL ~~LOC~~
30.0000 mL | Freq: Once | ORAL | Status: AC
  Administered 2014-10-10: 30 mL via ORAL
  Filled 2014-10-10: qty 30
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
## 2014-10-10 NOTE — ED Provider Notes (Signed)
CSN: [id number]     Arrival date & time 10/10/14  2151 History   First MD Initiated Contact with Patient 10/10/14 2158     Chief Complaint  Patient presents with  . Chest Pain     HPI   54 year old female presents today with chest pain. Patient reports that she is having chest "pressure and burning" located in her sternal area on Monday . Patient reports having  an EKG and her primary care provider's office that was nonspecific followed with blood work. She was contacted yesterday by her primary care physician and informed her she may have had "a heart attack" and needed to follow up with cardiology today. Patient reports that she does not insurance and was unable to follow with cardiology. She reports today she felt a similar presentation of pain described this as "burning"  in her chest with radiation up into her mouth and throat; associated with  diaphoresis, nausea, vomiting. Patient notes she has a significant past medical history of gastric ulcers, but she reports that she has never had associated nausea and vomiting with them. She reports the pain is similar in nature but more severe. Patient also notes over the last several months she's had increased swelling of her feet, he goes on to note she "holds fluid" and has been taking Lasix for this. Patient denies significant past medical history including cardiac or pulmonary. She has not had any recent surgeries, prolonged immobilization, malignancy, history DVT or PE.  Chart review shows a similar pattern of pain back in 2013 with chest pain and epigastric pain, date of stress test, negative cardiac enzymes, endoscopy with gastric ulcer. Patient reports that she was taking her Prilosec as directed until recently as she ran out and has been unable to get a prescription from her primary care provider.  Past Medical History  Diagnosis Date  . Diabetes mellitus without complication    Past Surgical History  Procedure Laterality Date  . Ablation     . Cholecystectomy    . Cesarean section    . Liver biopsy     No family history on file. History  Substance Use Topics  . Smoking status: Never Smoker   . Smokeless tobacco: Not on file  . Alcohol Use: No   OB History    No data available     Review of Systems  All other systems reviewed and are negative.   Allergies  Sulfa antibiotics; Fentanyl; and Penicillins  Home Medications   Prior to Admission medications   Not on File   BP 122/78 mmHg  Pulse 108  Temp(Src) 98.5 F (36.9 C) (Oral)  Resp 17  Ht 5\' 2"  (1.575 m)  Wt 150 lb (68.04 kg)  BMI 27.43 kg/m2  SpO2 95% Physical Exam  Constitutional: She is oriented to person, place, and time. She appears well-developed and well-nourished.  HENT:  Head: Normocephalic and atraumatic.  Eyes: Pupils are equal, round, and reactive to light.  Neck: Normal range of motion. Neck supple. No JVD present. No tracheal deviation present. No thyromegaly present.  Cardiovascular: Regular rhythm, normal heart sounds and intact distal pulses.  Exam reveals no gallop and no friction rub.   No murmur heard. Pulmonary/Chest: Effort normal and breath sounds normal. No stridor. No respiratory distress. She has no wheezes. She has no rales. She exhibits no tenderness.  Abdominal: Soft. There is tenderness. There is no rigidity, no rebound, no guarding, no CVA tenderness, no tenderness at McBurney's point and negative Murphy's sign.  Epigastric tenderness  Musculoskeletal: Normal range of motion.  Distal extremities equal bilateral small amount of swelling to feet bilateral  Lymphadenopathy:    She has no cervical adenopathy.  Neurological: She is alert and oriented to person, place, and time. Coordination normal.  Skin: Skin is warm and dry.  Psychiatric: She has a normal mood and affect. Her behavior is normal. Judgment and thought content normal.  Nursing note and vitals reviewed.   ED Course  Procedures (including critical care  time) Labs Review Labs Reviewed  CBC WITH DIFFERENTIAL/PLATELET - Abnormal; Notable for the following:    Hemoglobin 11.4 (*)    HCT 35.9 (*)    MCH 25.6 (*)    All other components within normal limits  COMPREHENSIVE METABOLIC PANEL - Abnormal; Notable for the following:    Potassium 3.4 (*)    Glucose, Bld 150 (*)    Calcium 8.5 (*)    Total Protein 5.3 (*)    Albumin 2.9 (*)    AST 42 (*)    All other components within normal limits  LIPASE, BLOOD - Abnormal; Notable for the following:    Lipase 20 (*)    All other components within normal limits  I-STAT TROPOININ, ED  Rosezena Sensor, ED    Imaging Review Dg Chest 2 View  10/10/2014   CLINICAL DATA:  Right chest pain and vomiting since Monday  EXAM: CHEST  2 VIEW  COMPARISON:  None.  FINDINGS: The heart size and mediastinal contours are within normal limits. Both lungs are clear. The visualized skeletal structures are unremarkable.  IMPRESSION: No active cardiopulmonary disease.   Electronically Signed   By: Ellery Plunk M.D.   On: 10/10/2014 23:24     EKG Interpretation   Date/Time:  Wednesday Oct 10 2014 21:56:28 EDT Ventricular Rate:  107 PR Interval:  169 QRS Duration: 82 QT Interval:  339 QTC Calculation: 452 R Axis:   68 Text Interpretation:  Sinus tachycardia Probable left atrial enlargement  Sinus tachycardia Artifact T wave abnormality Abnormal ekg Confirmed by  Gerhard Munch  MD [phone]) on 10/10/2014 11:07:49 PM      MDM   Final diagnoses:  PUD (peptic ulcer disease)    Labs: I-STAT troponin 2, CBC, CMP, lipase- no significant findings  Imaging: DG chest no significant findings  Consults: None  Therapeutics: GI cocktail, Toradol, Prilosec  Assessment/Plan: Patient presents with acute episode of epigastric pain with radiation up into her throat with associated nausea or vomiting. This likely represents a recurrence of her gastric ulcers as she has not been taking her Prilosec medication.  Patient was given a GI cocktail and had significant improvement in both her epigastric pain and burning in her throat. Patient was given aspirin, nitroglycerin in route via EMS this did not relieve her epigastric pain did cause her to have a headache. She was given Toradol with good relief in headache symptoms. Patient had 2 negative troponins, normal EKG, and has a hard score of 2, this is unlikely ACS. I find it unlikely that her primary care would call her with results of "heart attack" and not instruct her to follow-up in the emergency room rather cardiologist. Patient has a well score of 1.5 and is in the low risk group, this is unlikely PE as patient does have tachypnea but is not hypoxic no clinical signs of DVT, no history of PE or DVT. Patient will given him prescription for Prilosec, Zofran, and instructed to follow-up with her primary care provider  for further evaluation and management of her gastric symptoms. She'll be given GI consult as well and instructed to schedule follow-up appointment. Patient is given strict return precautions the event that her epigastric/chest pain returns or she experiences new or worsening signs or symptoms. Patient verbalized her understanding today's plan and agreed to follow-up evaluation. At time of discharge patient was comfortably resting in bed, smiling, and had no acute concerns other than minor epigastric pain.      Eyvonne MechanicJeffrey Estell Puccini, PA-C 10/11/14 0200  Gerhard Munchobert Lockwood, MD 10/11/14 2055

## 2014-10-10 NOTE — ED Notes (Signed)
Jeff Hedges PA at bedside.  

## 2014-10-11 LAB — I-STAT TROPONIN, ED: Troponin i, poc: 0 ng/mL (ref 0.00–0.08)

## 2014-10-11 MED ORDER — KETOROLAC TROMETHAMINE 15 MG/ML IJ SOLN
15.0000 mg | Freq: Once | INTRAMUSCULAR | Status: AC
  Administered 2014-10-11: 15 mg via INTRAVENOUS
  Filled 2014-10-11: qty 1

## 2014-10-11 MED ORDER — ONDANSETRON HCL 4 MG PO TABS: 4.0000 mg | ORAL_TABLET | Freq: Four times a day (QID) | ORAL | Status: DC

## 2014-10-11 MED ORDER — OMEPRAZOLE 20 MG PO CPDR
20.0000 mg | DELAYED_RELEASE_CAPSULE | Freq: Every day | ORAL | Status: DC
Start: 2014-10-11 — End: 2015-03-03

## 2014-10-11 MED ORDER — SUCRALFATE 1 G PO TABS: 1.0000 g | ORAL_TABLET | Freq: Three times a day (TID) | ORAL | Status: DC

## 2014-10-11 MED ORDER — PANTOPRAZOLE SODIUM 40 MG PO TBEC
40.0000 mg | DELAYED_RELEASE_TABLET | Freq: Every day | ORAL | Status: DC
  Administered 2014-10-11: 40 mg via ORAL
  Filled 2014-10-11: qty 1

## 2014-10-11 MED ORDER — DIAZEPAM 5 MG PO TABS
5.0000 mg | ORAL_TABLET | Freq: Once | ORAL | Status: AC
  Administered 2014-10-11: 5 mg via ORAL
  Filled 2014-10-11: qty 1

## 2014-10-11 NOTE — Discharge Instructions (Signed)
Peptic Ulcer A peptic ulcer is a sore in the lining of your esophagus (esophageal ulcer), stomach (gastric ulcer), or in the first part of your small intestine (duodenal ulcer). The ulcer causes erosion into the deeper tissue. CAUSES  Normally, the lining of the stomach and the small intestine protects itself from the acid that digests food. The protective lining can be damaged by:  An infection caused by a bacterium called Helicobacter pylori (H. pylori).  Regular use of nonsteroidal anti-inflammatory drugs (NSAIDs), such as ibuprofen or aspirin.  Smoking tobacco. Other risk factors include being older than 50, drinking alcohol excessively, and having a family history of ulcer disease.  SYMPTOMS   Burning pain or gnawing in the area between the chest and the belly button.  Heartburn.  Nausea and vomiting.  Bloating. The pain can be worse on an empty stomach and at night. If the ulcer results in bleeding, it can cause:  Black, tarry stools.  Vomiting of bright red blood.  Vomiting of coffee-ground-looking materials. DIAGNOSIS  A diagnosis is usually made based upon your history and an exam. Other tests and procedures may be performed to find the cause of the ulcer. Finding a cause will help determine the best treatment. Tests and procedures may include:  Blood tests, stool tests, or breath tests to check for the bacterium H. pylori.  An upper gastrointestinal (GI) series of the esophagus, stomach, and small intestine.  An endoscopy to examine the esophagus, stomach, and small intestine.  A biopsy. TREATMENT  Treatment may include:  Eliminating the cause of the ulcer, such as smoking, NSAIDs, or alcohol.  Medicines to reduce the amount of acid in your digestive tract.  Antibiotic medicines if the ulcer is caused by the H. pylori bacterium.  An upper endoscopy to treat a bleeding ulcer.  Surgery if the bleeding is severe or if the ulcer created a hole somewhere in the  digestive system. HOME CARE INSTRUCTIONS   Avoid tobacco, alcohol, and caffeine. Smoking can increase the acid in the stomach, and continued smoking will impair the healing of ulcers.  Avoid foods and drinks that seem to cause discomfort or aggravate your ulcer.  Only take medicines as directed by your caregiver. Do not substitute over-the-counter medicines for prescription medicines without talking to your caregiver.  Keep any follow-up appointments and tests as directed. SEEK MEDICAL CARE IF:   Your do not improve within 7 days of starting treatment.  You have ongoing indigestion or heartburn. SEEK IMMEDIATE MEDICAL CARE IF:   You have sudden, sharp, or persistent abdominal pain.  You have bloody or dark black, tarry stools.  You vomit blood or vomit that looks like coffee grounds.  You become light-headed, weak, or feel faint.  You become sweaty or clammy. MAKE SURE YOU:   Understand these instructions.  Will watch your condition.  Will get help right away if you are not doing well or get worse. Document Released: 05/15/2000 Document Revised: 10/02/2013 Document Reviewed: 12/16/2011 [facility] Patient Information 2015 [employer] [place]. This information is not intended to replace advice given to you by your health care provider. Make sure you discuss any questions you have with your health care provider.  Please monitor for new or worsening signs or symptoms, please follow-up immediately if any present. Please contact her primary care provider inform them of your visit today, schedule follow-up evaluation. [facility] as an outpatient facility if you cannot see her primary care provider. GI consult information was attached please contact  them for further evaluation and management.

## 2014-10-11 NOTE — ED Notes (Signed)
Patient requested that I charge her phone in the charging station in the lobby.  I explained to the patient that the charging station is open to the public and is not monitored and that there was a possibility of her phone being stolen.  I explained that our facility would not be responsible if this were to happen and she agreed and said that it is no problem.   Patient agrees that she would like her phone charged in the lobby and would not hold me or this facility liable if something were to happen to her phone.

## 2014-11-15 ENCOUNTER — Emergency Department
Admission: EM | Admit: 2014-11-15 | Discharge: 2014-11-15 | Disposition: A | Discharge status: Home / Self Care | Payer: 59 | Attending: Emergency Medicine | Admitting: Emergency Medicine

## 2014-11-15 ENCOUNTER — Emergency Department: Payer: 59

## 2014-11-15 ENCOUNTER — Encounter: Payer: Self-pay | Admitting: Emergency Medicine

## 2014-11-15 DIAGNOSIS — Z880 Allergy status to penicillin: Secondary | ICD-10-CM | POA: Diagnosis not present

## 2014-11-15 DIAGNOSIS — Y9289 Other specified places as the place of occurrence of the external cause: Secondary | ICD-10-CM | POA: Diagnosis not present

## 2014-11-15 DIAGNOSIS — E119 Type 2 diabetes mellitus without complications: Secondary | ICD-10-CM | POA: Insufficient documentation

## 2014-11-15 DIAGNOSIS — Y998 Other external cause status: Secondary | ICD-10-CM | POA: Diagnosis not present

## 2014-11-15 DIAGNOSIS — S8992XA Unspecified injury of left lower leg, initial encounter: Secondary | ICD-10-CM | POA: Diagnosis present

## 2014-11-15 DIAGNOSIS — S80212A Abrasion, left knee, initial encounter: Secondary | ICD-10-CM | POA: Diagnosis not present

## 2014-11-15 DIAGNOSIS — W1839XA Other fall on same level, initial encounter: Secondary | ICD-10-CM | POA: Insufficient documentation

## 2014-11-15 DIAGNOSIS — Z720 Tobacco use: Secondary | ICD-10-CM | POA: Diagnosis not present

## 2014-11-15 DIAGNOSIS — Z79899 Other long term (current) drug therapy: Secondary | ICD-10-CM | POA: Diagnosis not present

## 2014-11-15 DIAGNOSIS — Z7982 Long term (current) use of aspirin: Secondary | ICD-10-CM | POA: Insufficient documentation

## 2014-11-15 DIAGNOSIS — Y9389 Activity, other specified: Secondary | ICD-10-CM | POA: Diagnosis not present

## 2014-11-15 DIAGNOSIS — S8002XA Contusion of left knee, initial encounter: Secondary | ICD-10-CM

## 2014-11-15 DIAGNOSIS — M25462 Effusion, left knee: Secondary | ICD-10-CM | POA: Insufficient documentation

## 2014-11-15 HISTORY — DX: Disorder of thyroid, unspecified: E07.9

## 2014-11-15 MED ORDER — IBUPROFEN 800 MG PO TABS
ORAL_TABLET | ORAL | Status: AC
  Administered 2014-11-15: 800 mg via ORAL
  Filled 2014-11-15: qty 1

## 2014-11-15 MED ORDER — BACITRACIN-NEOMYCIN-POLYMYXIN OINTMENT TUBE
TOPICAL_OINTMENT | Freq: Once | CUTANEOUS | Status: AC
Start: 2014-11-15 — End: 2014-11-15
  Administered 2014-11-15: 1 via TOPICAL
  Filled 2014-11-15: qty 15

## 2014-11-15 MED ORDER — TRAMADOL HCL 50 MG PO TABS
ORAL_TABLET | ORAL | Status: AC
  Filled 2014-11-15: qty 1

## 2014-11-15 MED ORDER — IBUPROFEN 800 MG PO TABS: 800.0000 mg | ORAL_TABLET | Freq: Three times a day (TID) | ORAL | Status: DC | PRN

## 2014-11-15 MED ORDER — IBUPROFEN 800 MG PO TABS
800.0000 mg | ORAL_TABLET | Freq: Once | ORAL | Status: AC
  Administered 2014-11-15: 800 mg via ORAL

## 2014-11-15 MED ORDER — TRAMADOL HCL 50 MG PO TABS
50.0000 mg | ORAL_TABLET | Freq: Four times a day (QID) | ORAL | Status: DC | PRN
Start: 2014-11-15 — End: 2014-11-30

## 2014-11-15 MED ORDER — TRAMADOL HCL 50 MG PO TABS
50.0000 mg | ORAL_TABLET | Freq: Once | ORAL | Status: AC
  Administered 2014-11-15: 50 mg via ORAL

## 2014-11-15 MED ORDER — BACITRACIN-NEOMYCIN-POLYMYXIN 400-5-5000 EX OINT
TOPICAL_OINTMENT | CUTANEOUS | Status: AC
Start: 2014-11-15 — End: 2014-11-15
  Administered 2014-11-15: 1 via TOPICAL
  Filled 2014-11-15: qty 2

## 2014-11-15 NOTE — ED Provider Notes (Signed)
[facility] Emergency Department Provider Note  ____________________________________________  Time seen: Approximately 4:50 PM  I have reviewed the triage vital signs and the nursing notes.   HISTORY  Chief Complaint Knee Pain and Abrasion    HPI Christy Mcbride is a 54 y.o. female complaining of left knee and left lower leg pain secondary to a fall on concrete 4 days ago.Patient states she was unable to ambulate for 2 days and now cannot ambulate by not bending the knee. Patient states she is noticing edema to the lower leg along with erythema from the abrasions. Patient denies any fever. Patient is rating the pain as 8/10 which increase ambulation.   Past Medical History  Diagnosis Date  . Diabetes mellitus without complication   . Thyroid disease     There are no active problems to display for this patient.   Past Surgical History  Procedure Laterality Date  . Ablation    . Cholecystectomy    . Cesarean section    . Liver biopsy      Current Outpatient Rx  Name  Route  Sig  Dispense  Refill  . aspirin 81 MG chewable tablet   Oral   Chew 324 mg by mouth once.         . diazepam (VALIUM) 5 MG tablet   Oral   Take 5 mg by mouth 2 (two) times daily as needed. For anxiety and sleep         . Estradiol-Norethindrone Acet 0.5-0.1 MG per tablet   Oral   Take 1 tablet by mouth daily.         . fentaNYL (SUBLIMAZE) 100 MCG/2ML injection   Intravenous   Inject 150 mcg into the vein once.         Marland Kitchen ibuprofen (ADVIL,MOTRIN) 800 MG tablet   Oral   Take 1 tablet (800 mg total) by mouth every 8 (eight) hours as needed for moderate pain.   15 tablet   0   . nitroGLYCERIN (NITROSTAT) 0.4 MG SL tablet   Sublingual   Place 0.4 mg under the tongue every 5 (five) minutes as needed for chest pain.         Marland Kitchen omeprazole (PRILOSEC) 20 MG capsule   Oral   Take 1 capsule (20 mg total) by mouth daily.   30 capsule   0   . ondansetron (ZOFRAN)  4 MG tablet   Oral   Take 1 tablet (4 mg total) by mouth every 6 (six) hours.   12 tablet   0   . sucralfate (CARAFATE) 1 G tablet   Oral   Take 1 tablet (1 g total) by mouth 4 (four) times daily -  with meals and at bedtime.   30 tablet   0   . traMADol (ULTRAM) 50 MG tablet   Oral   Take 1 tablet (50 mg total) by mouth every 6 (six) hours as needed.   20 tablet   0     Allergies Sulfa antibiotics; Codeine; Fentanyl; Penicillins; and Topiramate  No family history on file.  Social History History  Substance Use Topics  . Smoking status: Current Some Day Smoker    Types: Cigarettes  . Smokeless tobacco: Not on file  . Alcohol Use: Yes     Comment: occas.    Review of Systems Constitutional: No fever/chills Eyes: No visual changes. ENT: No sore throat. Cardiovascular: Denies chest pain. Respiratory: Denies shortness of breath. Gastrointestinal: No abdominal pain.  No nausea, no vomiting.  No diarrhea.  No constipation. Genitourinary: Negative for dysuria. Musculoskeletal: Positive for left lower extremity pain involving the knee in the lower left leg.  Skin: Abrasions left anterior knee and the distal third of the left leg. Neurological: Negative for headaches, focal weakness or numbness. Endocrine:Diabetes and hypothyroidism. Allergic/Immunilogical: See medication list 10-point ROS otherwise negative.  ____________________________________________   PHYSICAL EXAM:  VITAL SIGNS: ED Triage Vitals  Enc Vitals Group     BP 11/15/14 1624 152/83 mmHg     Pulse Rate 11/15/14 1624 91     Resp 11/15/14 1624 18     Temp 11/15/14 1624 98.2 F (36.8 C)     Temp Source 11/15/14 1624 Oral     SpO2 11/15/14 1624 99 %     Weight 11/15/14 1624 150 lb (68.04 kg)     Height 11/15/14 1624 5\' 2"  (1.575 m)     Head Cir --      Peak Flow --      Pain Score 11/15/14 1625 8     Pain Loc --      Pain Edu? --      Excl. in GC? --     Constitutional: Alert and oriented.  Well appearing and in no acute distress. Eyes: Conjunctivae are normal. PERRL. EOMI. Head: Atraumatic. Nose: No congestion/rhinnorhea. Mouth/Throat: Mucous membranes are moist.  Oropharynx non-erythematous. Neck: No stridor. No deformity for nuchal range of motion nontender palpation. Hematological/Lymphatic/Immunilogical: No inguinal lymphadenopathy. Cardiovascular: Normal rate, regular rhythm. Grossly normal heart sounds.  Good peripheral circulation. Pressure slightly elevated Respiratory: Normal respiratory effort.  No retractions. Lungs CTAB. Gastrointestinal: Soft and nontender. No distention. No abdominal bruits. No CVA tenderness. Musculoskeletal: No deformity to the left knee circular abrasion possibly 2 cm in circumference. Patella is tender to palpation with marked crepitus. Patient has full nuchal range of motion nonweightbearing. Examination lower leg shows erythema and edema secondary to 2 abrasions. Neurologic:  Normal speech and language. No gross focal neurologic deficits are appreciated. Speech is normal. No gait instability. Skin:  Skin is warm, multiple abrasions. Psychiatric: Mood and affect are normal. Speech and behavior are normal.  ____________________________________________   LABS (all labs ordered are listed, but only abnormal results are displayed)  Labs Reviewed - No data to display ____________________________________________  EKG   ____________________________________________  RADIOLOGY  Mild knee effusion and patella spur. ____________________________________________   PROCEDURES  Procedure(s) performed: None  Critical Care performed: No  ____________________________________________   INITIAL IMPRESSION / ASSESSMENT AND PLAN / ED COURSE  Pertinent labs & imaging results that were available during my care of the patient were reviewed by me and considered in my medical decision making (see chart for details).  Left lower leg contusion and  abrasion with mild cellulitis. ____________________________________________   FINAL CLINICAL IMPRESSION(S) / ED DIAGNOSES  Final diagnoses:  Knee effusion, left  Knee contusion, left, initial encounter  Abrasion of left knee, initial encounter      Joni Reining, PA-C 11/15/14 1744  Governor Rooks, MD 11/15/14 [phone]

## 2014-11-15 NOTE — Discharge Instructions (Signed)
Wear knee support for 2-3 days. °

## 2014-11-15 NOTE — ED Notes (Signed)
AAOx3.  Skin warm and dry.  Ambulates with steady gait, favors right leg.  NAD.

## 2014-11-15 NOTE — ED Notes (Signed)
Pt states she fell on Sat, has a scab on her left knee, pt concerned it is infected, also 2 abrasions to lower leg from fall noted. Sites appear to be healing. Pt appears in no distress.

## 2014-11-30 ENCOUNTER — Telehealth: Payer: Self-pay | Admitting: Emergency Medicine

## 2014-11-30 ENCOUNTER — Encounter: Payer: Self-pay | Admitting: Emergency Medicine

## 2014-11-30 ENCOUNTER — Emergency Department
Admission: EM | Admit: 2014-11-30 | Discharge: 2014-11-30 | Disposition: A | Discharge status: Home / Self Care | Payer: 59 | Attending: Emergency Medicine | Admitting: Emergency Medicine

## 2014-11-30 DIAGNOSIS — L089 Local infection of the skin and subcutaneous tissue, unspecified: Secondary | ICD-10-CM | POA: Diagnosis not present

## 2014-11-30 DIAGNOSIS — Z7982 Long term (current) use of aspirin: Secondary | ICD-10-CM | POA: Insufficient documentation

## 2014-11-30 DIAGNOSIS — Z880 Allergy status to penicillin: Secondary | ICD-10-CM | POA: Diagnosis not present

## 2014-11-30 DIAGNOSIS — E119 Type 2 diabetes mellitus without complications: Secondary | ICD-10-CM | POA: Insufficient documentation

## 2014-11-30 DIAGNOSIS — Z79899 Other long term (current) drug therapy: Secondary | ICD-10-CM | POA: Diagnosis not present

## 2014-11-30 DIAGNOSIS — M25562 Pain in left knee: Secondary | ICD-10-CM | POA: Insufficient documentation

## 2014-11-30 DIAGNOSIS — M25462 Effusion, left knee: Secondary | ICD-10-CM | POA: Diagnosis not present

## 2014-11-30 DIAGNOSIS — R21 Rash and other nonspecific skin eruption: Secondary | ICD-10-CM | POA: Diagnosis present

## 2014-11-30 DIAGNOSIS — B958 Unspecified staphylococcus as the cause of diseases classified elsewhere: Secondary | ICD-10-CM

## 2014-11-30 MED ORDER — TRAMADOL HCL 50 MG PO TABS: 50.0000 mg | ORAL_TABLET | Freq: Four times a day (QID) | ORAL | Status: DC | PRN

## 2014-11-30 MED ORDER — CEPHALEXIN 500 MG PO CAPS: 500.0000 mg | ORAL_CAPSULE | Freq: Three times a day (TID) | ORAL | Status: AC

## 2014-11-30 NOTE — ED Notes (Signed)
Patient called asking about an antibiotic she was supposed to have gotten.  seh was seen on 6/16 and says she did not receive the antibiotic rx, so she thought she would be okay without it.  Says now the skin rash, pus is spreadiing as far as her abdomen and under breasts.  She says she wonders if she has a blood infection. Says her pcp is out of town for next week. i advised her to return for recheck.  She agrees.

## 2014-11-30 NOTE — ED Notes (Signed)
Small vesicles that appear pus filled that she noticed first on knee, then she noticed more just below belt line, some under her breasts, behind her ear and on her hair line.  Described as painful and itching.

## 2014-11-30 NOTE — ED Provider Notes (Signed)
[facility] Emergency Department Provider Note ____________________________________________  Time seen: Approximately 3:55 PM  I have reviewed the triage vital signs and the nursing notes.   HISTORY  Chief Complaint Rash   HPI Christy Mcbride is a 54 y.o. female who presents to the emergency department for evaluation of left knee pain and rash that has spread from her knee to her abdomen then to her back and neck. She denies having skin infections in the past. Pain in the left knee continues. She is unable to wear the knee immobilizer because it is too long.   Past Medical History  Diagnosis Date  . Diabetes mellitus without complication   . Thyroid disease     There are no active problems to display for this patient.   Past Surgical History  Procedure Laterality Date  . Ablation    . Cholecystectomy    . Cesarean section    . Liver biopsy      Current Outpatient Rx  Name  Route  Sig  Dispense  Refill  . aspirin 81 MG chewable tablet   Oral   Chew 324 mg by mouth once.         . cephALEXin (KEFLEX) 500 MG capsule   Oral   Take 1 capsule (500 mg total) by mouth 3 (three) times daily.   40 capsule   0   . diazepam (VALIUM) 5 MG tablet   Oral   Take 5 mg by mouth 2 (two) times daily as needed. For anxiety and sleep         . Estradiol-Norethindrone Acet 0.5-0.1 MG per tablet   Oral   Take 1 tablet by mouth daily.         . fentaNYL (SUBLIMAZE) 100 MCG/2ML injection   Intravenous   Inject 150 mcg into the vein once.         Marland Kitchen. ibuprofen (ADVIL,MOTRIN) 800 MG tablet   Oral   Take 1 tablet (800 mg total) by mouth every 8 (eight) hours as needed for moderate pain.   15 tablet   0   . nitroGLYCERIN (NITROSTAT) 0.4 MG SL tablet   Sublingual   Place 0.4 mg under the tongue every 5 (five) minutes as needed for chest pain.         Marland Kitchen. omeprazole (PRILOSEC) 20 MG capsule   Oral   Take 1 capsule (20 mg total) by mouth daily.   30  capsule   0   . ondansetron (ZOFRAN) 4 MG tablet   Oral   Take 1 tablet (4 mg total) by mouth every 6 (six) hours.   12 tablet   0   . sucralfate (CARAFATE) 1 G tablet   Oral   Take 1 tablet (1 g total) by mouth 4 (four) times daily -  with meals and at bedtime.   30 tablet   0   . traMADol (ULTRAM) 50 MG tablet   Oral   Take 1 tablet (50 mg total) by mouth every 6 (six) hours as needed.   9 tablet   0     Allergies Sulfa antibiotics; Codeine; Fentanyl; Penicillins; and Topiramate  No family history on file.  Social History History  Substance Use Topics  . Smoking status: Current Some Day Smoker    Types: Cigarettes  . Smokeless tobacco: Not on file  . Alcohol Use: Yes     Comment: occas.    Review of Systems Constitutional: No fever/chills Eyes: No visual changes. ENT:  No sore throat. Cardiovascular: Denies chest pain. Respiratory: Denies shortness of breath. Gastrointestinal: No abdominal pain.  No nausea, no vomiting.  No diarrhea.  No constipation. Genitourinary: Negative for dysuria. Musculoskeletal: Positive for left knee pain. Skin: Positive for rash. Neurological: Negative for headaches, focal weakness or numbness.  10-point ROS otherwise negative.  ____________________________________________   PHYSICAL EXAM:  VITAL SIGNS: ED Triage Vitals  Enc Vitals Group     BP 11/30/14 1407 118/76 mmHg     Pulse Rate 11/30/14 1407 91     Resp 11/30/14 1407 18     Temp 11/30/14 1407 98.1 F (36.7 C)     Temp Source 11/30/14 1407 Oral     SpO2 11/30/14 1407 95 %     Weight 11/30/14 1414 150 lb (68.04 kg)     Height 11/30/14 1414  (1.575 m)     Head Cir --      Peak Flow --      Pain Score 11/30/14 1414 10     Pain Loc --      Pain Edu? --      Excl. in GC? --     Constitutional: Alert and oriented. Well appearing and in no acute distress. Eyes: Conjunctivae are normal. PERRL. EOMI. Head: Atraumatic. Nose: No  congestion/rhinnorhea. Mouth/Throat: Mucous membranes are moist.  Oropharynx non-erythematous. Neck: No stridor.   Cardiovascular: Normal rate, regular rhythm. Grossly normal heart sounds.  Good peripheral circulation. Respiratory: Normal respiratory effort.  No retractions. Lungs CTAB. Gastrointestinal: Soft and nontender. No distention. No abdominal bruits. No CVA tenderness. Musculoskeletal: Full ROM of the left knee. Mild swelling present.  Neurologic:  Normal speech and language. No gross focal neurologic deficits are appreciated. Speech is normal. No gait instability. Skin:  Maculopapular, erythematous isolated lesions--some with pustules, others with scabbing--around abrasion on left prepatellar area, left leg, lower abdomen, upper back, neck, and hairline. Psychiatric: Mood and affect are normal. Speech and behavior are normal.  ____________________________________________   LABS (all labs ordered are listed, but only abnormal results are displayed)  Labs Reviewed - No data to display ____________________________________________  EKG   ____________________________________________  RADIOLOGY  Not indicated. ____________________________________________   PROCEDURES  Procedure(s) performed: None  Critical Care performed: No  ____________________________________________   INITIAL IMPRESSION / ASSESSMENT AND PLAN / ED COURSE  Pertinent labs & imaging results that were available during my care of the patient were reviewed by me and considered in my medical decision making (see chart for details).  Patient was advised to follow up with orthopedics. She was also advised to follow up with her primary care provider or return to the ER for skin symptoms that are not improving over the next 3 days. ____________________________________________   FINAL CLINICAL IMPRESSION(S) / ED DIAGNOSES  Final diagnoses:  Staph skin infection  Pain and swelling of knee, left       Chinita Pester, FNP 11/30/14 1613  Loleta Rose, MD 11/30/14 2108

## 2014-11-30 NOTE — ED Notes (Signed)
Vital signs stable. 

## 2014-11-30 NOTE — Discharge Instructions (Signed)
Purchase a knee sleeve at the pharmacy and wear any time you are working or out of bed. Follow up with the orthopedic doctor. Call on Monday for an appointment.

## 2014-12-14 ENCOUNTER — Encounter: Payer: Self-pay | Admitting: Emergency Medicine

## 2014-12-14 ENCOUNTER — Emergency Department
Admission: EM | Admit: 2014-12-14 | Discharge: 2014-12-14 | Disposition: A | Discharge status: Home / Self Care | Payer: 59 | Attending: Student | Admitting: Student

## 2014-12-14 DIAGNOSIS — R21 Rash and other nonspecific skin eruption: Secondary | ICD-10-CM | POA: Diagnosis present

## 2014-12-14 DIAGNOSIS — Z792 Long term (current) use of antibiotics: Secondary | ICD-10-CM | POA: Diagnosis not present

## 2014-12-14 DIAGNOSIS — A4902 Methicillin resistant Staphylococcus aureus infection, unspecified site: Secondary | ICD-10-CM | POA: Insufficient documentation

## 2014-12-14 DIAGNOSIS — Z7982 Long term (current) use of aspirin: Secondary | ICD-10-CM | POA: Insufficient documentation

## 2014-12-14 DIAGNOSIS — Z880 Allergy status to penicillin: Secondary | ICD-10-CM | POA: Insufficient documentation

## 2014-12-14 DIAGNOSIS — B86 Scabies: Secondary | ICD-10-CM | POA: Insufficient documentation

## 2014-12-14 DIAGNOSIS — E119 Type 2 diabetes mellitus without complications: Secondary | ICD-10-CM | POA: Diagnosis not present

## 2014-12-14 DIAGNOSIS — Z793 Long term (current) use of hormonal contraceptives: Secondary | ICD-10-CM | POA: Diagnosis not present

## 2014-12-14 DIAGNOSIS — Z720 Tobacco use: Secondary | ICD-10-CM | POA: Insufficient documentation

## 2014-12-14 DIAGNOSIS — Z79899 Other long term (current) drug therapy: Secondary | ICD-10-CM | POA: Diagnosis not present

## 2014-12-14 MED ORDER — LINDANE 1 % EX LOTN: 1.0000 "application " | TOPICAL_LOTION | Freq: Once | CUTANEOUS | Status: DC

## 2014-12-14 MED ORDER — CLINDAMYCIN HCL 150 MG PO CAPS: 150.0000 mg | ORAL_CAPSULE | Freq: Three times a day (TID) | ORAL | Status: DC

## 2014-12-14 MED ORDER — HYDROXYZINE PAMOATE 25 MG PO CAPS: 25.0000 mg | ORAL_CAPSULE | Freq: Three times a day (TID) | ORAL | Status: DC | PRN

## 2014-12-14 MED ORDER — RANITIDINE HCL 150 MG PO TABS: 150.0000 mg | ORAL_TABLET | Freq: Two times a day (BID) | ORAL | Status: DC

## 2014-12-14 NOTE — ED Provider Notes (Signed)
[facility] Emergency Department Provider Note  ____________________________________________  Time seen: Approximately 1:02 PM  I have reviewed the triage vital signs and the nursing notes.   HISTORY  Chief Complaint Rash    HPI Christy Mcbride is a 54 y.o. female presenting to the emergency room with a pruritic rash.  She was treated for a staph related rash two weeks ago with Keflex.  She noticed new spots appearing last Saturday as she was finishing her round of antibiotics.  They are now located on the right side of her neck, hairline, left shoulder, left bicep, and upper chest.  The rash itches and is keeping her up at night.  She also states that her feet have been swollen for a few weeks.  She is diabetic and has never seen a podiatrist.  Past Medical History  Diagnosis Date  . Diabetes mellitus without complication   . Thyroid disease     There are no active problems to display for this patient.   Past Surgical History  Procedure Laterality Date  . Ablation    . Cholecystectomy    . Cesarean section    . Liver biopsy      Current Outpatient Rx  Name  Route  Sig  Dispense  Refill  . aspirin 81 MG chewable tablet   Oral   Chew 324 mg by mouth once.         . clindamycin (CLEOCIN) 150 MG capsule   Oral   Take 1 capsule (150 mg total) by mouth 3 (three) times daily.   30 capsule   0   . diazepam (VALIUM) 5 MG tablet   Oral   Take 5 mg by mouth 2 (two) times daily as needed. For anxiety and sleep         . Estradiol-Norethindrone Acet 0.5-0.1 MG per tablet   Oral   Take 1 tablet by mouth daily.         . fentaNYL (SUBLIMAZE) 100 MCG/2ML injection   Intravenous   Inject 150 mcg into the vein once.         . hydrOXYzine (VISTARIL) 25 MG capsule   Oral   Take 1 capsule (25 mg total) by mouth 3 (three) times daily as needed.   30 capsule   0   . ibuprofen (ADVIL,MOTRIN) 800 MG tablet   Oral   Take 1 tablet (800 mg total) by  mouth every 8 (eight) hours as needed for moderate pain.   15 tablet   0   . lindane lotion (KWELL) 1 %   Topical   Apply 1 application topically once. At bedtime, wash off in the am.   30 mL   0   . nitroGLYCERIN (NITROSTAT) 0.4 MG SL tablet   Sublingual   Place 0.4 mg under the tongue every 5 (five) minutes as needed for chest pain.         Marland Kitchen omeprazole (PRILOSEC) 20 MG capsule   Oral   Take 1 capsule (20 mg total) by mouth daily.   30 capsule   0   . ondansetron (ZOFRAN) 4 MG tablet   Oral   Take 1 tablet (4 mg total) by mouth every 6 (six) hours.   12 tablet   0   . ranitidine (ZANTAC) 150 MG tablet   Oral   Take 1 tablet (150 mg total) by mouth 2 (two) times daily.   14 tablet   1   . sucralfate (CARAFATE) 1 G  tablet   Oral   Take 1 tablet (1 g total) by mouth 4 (four) times daily -  with meals and at bedtime.   30 tablet   0   . traMADol (ULTRAM) 50 MG tablet   Oral   Take 1 tablet (50 mg total) by mouth every 6 (six) hours as needed.   9 tablet   0     Allergies Sulfa antibiotics; Codeine; Fentanyl; Penicillins; and Topiramate  No family history on file.  Social History History  Substance Use Topics  . Smoking status: Current Some Day Smoker    Types: Cigarettes  . Smokeless tobacco: Not on file  . Alcohol Use: Yes     Comment: occas.    Review of Systems Constitutional: No fever/chills Eyes: No visual changes. ENT: No sore throat. Cardiovascular: Denies chest pain. Respiratory: Denies shortness of breath. Gastrointestinal: No abdominal pain.  No nausea, no vomiting.  No diarrhea.  No constipation. Genitourinary: Negative for dysuria. Musculoskeletal: Negative for back pain. Skin: Pruritic rash on chest, left arm, neck and hairline. No pustules, vesicles. Lots of excoriated areas noted. Questionable linear lines vs. Scratching. Neurological: Negative for headaches, focal weakness or numbness.  10-point ROS otherwise  negative.  ____________________________________________   PHYSICAL EXAM:  VITAL SIGNS: ED Triage Vitals  Enc Vitals Group     BP --      Pulse --      Resp --      Temp --      Temp src --      SpO2 --      Weight --      Height --      Head Cir --      Peak Flow --      Pain Score --      Pain Loc --      Pain Edu? --      Excl. in GC? --     Constitutional: Alert and oriented. Well appearing and in no acute distress.  Eyes: Conjunctivae are normal. PERRL. EOMI. Head: Atraumatic. Nose: No congestion/rhinnorhea. Mouth/Throat: Mucous membranes are moist.  Oropharynx non-erythematous.   Cardiovascular: Normal rate, regular rhythm. Grossly normal heart sounds.  Good peripheral circulation. Respiratory: Normal respiratory effort.  No retractions. Lungs CTAB. Musculoskeletal: Swelling of the feet bilaterally Neurologic:  Normal speech and language. No gross focal neurologic deficits are appreciated. No gait instability. Skin:  Linear, erythematous, macular lesions located on the neck, scalp, chest, and left arm and shoulder.  Psychiatric: Mood and affect are normal. Speech and behavior are normal.  ____________________________________________   LABS (all labs ordered are listed, but only abnormal results are displayed)  Labs Reviewed - No data to display ____________________________________________  EKG  Not applicable ____________________________________________  RADIOLOGY  Not applicable ____________________________________________   PROCEDURES  Procedure(s) performed: None  Critical Care performed: No  ____________________________________________   INITIAL IMPRESSION / ASSESSMENT AND PLAN / ED COURSE  Pertinent labs & imaging results that were available during my care of the patient were reviewed by me and considered in my medical decision making (see chart for details).  Acute scabies versus MRSA rash. Rx given for lindane lotion 1 day use. Rx  given for clindamycin and 50 mg 3 times a day Vistaril and Zantac given for itching. Patient voices no other emergency medical complaints at this time will return to the ER with any worsening symptomology. She didn't will follow-up with podiatry as needed for feet, and dermatology and medicine for the  rash if it doesn't resolve. ____________________________________________   FINAL CLINICAL IMPRESSION(S) / ED DIAGNOSES  Final diagnoses:  Scabies infestation  MRSA infection      Evangeline Dakin, PA-C 12/14/14 1437  Gayla Doss, MD 12/14/14 1535

## 2014-12-14 NOTE — ED Notes (Signed)
Pt informed to return if any life threatening symptoms occur.  

## 2014-12-14 NOTE — ED Notes (Signed)
States she was seen for rash about 3 weeks ago. Placed on keflex and conts to have breakouts

## 2014-12-14 NOTE — ED Notes (Signed)
Pt reports she was seen here a couple of weeks ago, told she had a staph infection, reports taking most of the antibiotics. Pt reports rash is getting worse, reports rash to arms and chest.

## 2014-12-14 NOTE — Discharge Instructions (Signed)
Scabies Scabies are small bugs (mites) that burrow under the skin and cause red bumps and severe itching. These bugs can only be seen with a microscope. Scabies are highly contagious. They can spread easily from person to person by direct contact. They are also spread through sharing clothing or linens that have the scabies mites living in them. It is not unusual for an entire family to become infected through shared towels, clothing, or bedding.  HOME CARE INSTRUCTIONS   Your caregiver may prescribe a cream or lotion to kill the mites. If cream is prescribed, massage the cream into the entire body from the neck to the bottom of both feet. Also massage the cream into the scalp and face if your child is less than 44 year old. Avoid the eyes and mouth. Do not wash your hands after application.  Leave the cream on for 8 to 12 hours. Your child should bathe or shower after the 8 to 12 hour application period. Sometimes it is helpful to apply the cream to your child right before bedtime.  One treatment is usually effective and will eliminate approximately 95% of infestations. For severe cases, your caregiver may decide to repeat the treatment in 1 week. Everyone in your household should be treated with one application of the cream.  New rashes or burrows should not appear within 24 to 48 hours after successful treatment. However, the itching and rash may last for 2 to 4 weeks after successful treatment. Your caregiver may prescribe a medicine to help with the itching or to help the rash go away more quickly.  Scabies can live on clothing or linens for up to 3 days. All of your child's recently used clothing, towels, stuffed toys, and bed linens should be washed in hot water and then dried in a dryer for at least 20 minutes on high heat. Items that cannot be washed should be enclosed in a plastic bag for at least 3 days.  To help relieve itching, bathe your child in a cool bath or apply cool washcloths to the  affected areas.  Your child may return to school after treatment with the prescribed cream. SEEK MEDICAL CARE IF:   The itching persists longer than 4 weeks after treatment.  The rash spreads or becomes infected. Signs of infection include red blisters or yellow-tan crust. Document Released: 05/18/2005 Document Revised: 08/10/2011 Document Reviewed: 09/26/2008 [facility] Patient Information 2015 [place], [place]. This information is not intended to replace advice given to you by your health care provider. Make sure you discuss any questions you have with your health care provider.  MRSA Infection MRSA stands for methicillin-resistant Staphylococcus aureus. This type of infection is caused by Staphylococcus aureus bacteria that are no longer affected by the medicines used to kill them (drug resistant). Staphylococcus (staph) bacteria are normally found on the skin or in the nose of healthy people. In most cases, these bacteria do not cause infection. But if these resistant bacteria enter your body through a cut or sore, they can cause a serious infection on your skin or in other parts of your body. There is a slight chance that the staph on your skin or in your nose is MRSA. There are two types of MRSA infections:  Hospital-acquired MRSA is bacteria that you get in the hospital.  Community-acquired MRSA is bacteria that you get somewhere other than in a hospital. RISK FACTORS Hospital-acquired MRSA is more common. You could be at risk for this infection if you are in  the hospital and you:  Have surgery or a procedure.  Have an IV access or a catheter tube placed in your body.  Have weak resistance to germs (weakened immune system).  Are elderly.  Are on kidney dialysis. You could be at risk for community-acquired MRSA if you have a break in your skin and come into contact with MRSA. This may happen if you:  Play sports where there is skin-to-skin contact.  Live in a crowded setting,  like a dormitory or a [employer].  Share towels, razors, or sports equipment with other people. SYMPTOMS  Symptoms of hospital-acquired MRSA depend on where MRSA has spread. Symptoms may include:  Wound infection.  Skin infection.  Rash.  Pneumonia.  Fever and chills.  Difficulty breathing.  Chest pain. Community-acquired MRSA is most likely to start as a scratch or cut that becomes infected. Symptoms may include:  A pus-filled pimple.  A boil on your skin.  Pus draining from your skin.  A sore (abscess) under your skin or somewhere in your body.  Fever with or without chills. DIAGNOSIS  The diagnosis of MRSA is made by taking a sample from an infected area and sending it to a lab for testing. A lab technician can grow (culture) MRSA and check it under a microscope. The cultured MRSA can be tested to see which type of antibiotic medicine will work to treat it. Newer tests can identify MRSA more quickly by testing bacteria samples for MRSA genes. Your health care provider can diagnose MRSA using samples from:   Cuts or wounds in infected areas.  Nasal swabs.  Saliva or cough specimens from deep in the lungs (sputum).  Urine.  Blood. You may also have:  Imaging studies (such as X-ray or MRI) to check if the infection has spread to the lungs, bones, or joints.  A culture and sensitivity test of blood or fluids from inside the joints. TREATMENT  Treatment depends on how severe, deep, or extensive the infection is. Very bad infections may require a hospital stay.  Some skin infections, such as a small boil or sore (abscess), may be treated by draining pus from the site of the infection.  More extensive surgery to drain pus may be necessary for deeper or more widespread soft tissue infections.  You may then have to take antibiotic medicine given by mouth or through a vein. You may start antibiotic treatment right away or after testing can be done to see what  antibiotic medicine should be used. HOME CARE INSTRUCTIONS   Take your antibiotics as directed by your health care provider. Take the medicine as prescribed until it is finished.  Avoid close contact with those around you as much as possible. Do not use towels, razors, toothbrushes, bedding, or other items that will be used by others.  Wash your hands frequently for 15 seconds with soap and water. Dry your hands with a clean or disposable towel.  When you are not able to wash your hands, use hand sanitizer that is more than 60 percent alcohol.  Wash towels, sheets, or clothes in the washing machine with detergent and hot water. Dry them in a hot dryer.  Follow your health care provider's instructions for wound care. Wash your hands before and after changing your bandages.  Always shower after exercising.  Keep all cuts and scrapes clean and covered with a bandage.  Be sure to tell all your health care providers that you have MRSA so they are aware of  your infection. SEEK MEDICAL CARE IF:  You have a cut, scrape, pimple, or boil that becomes red, swollen, or painful or has pus in it.  You have pus draining from your skin.  You have an abscess under your skin or somewhere in your body. SEEK IMMEDIATE MEDICAL CARE IF:   You have symptoms of a skin infection with a fever or chills.  You have trouble breathing.  You have chest pain.  You have a skin wound and you become nauseous or start vomiting. MAKE SURE YOU:  Understand these instructions.  Will watch your condition.  Will get help right away if you are not doing well or get worse. Document Released: 05/18/2005 Document Revised: 05/23/2013 Document Reviewed: 03/10/2013 [facility] Patient Information 2015 [place]. This information is not intended to replace advice given to you by your health care provider. Make sure you discuss any questions you have with your health care provider.

## 2015-01-27 ENCOUNTER — Encounter: Payer: Self-pay | Admitting: *Deleted

## 2015-01-27 ENCOUNTER — Emergency Department
Admission: EM | Admit: 2015-01-27 | Discharge: 2015-01-27 | Disposition: A | Discharge status: Home / Self Care | Payer: 59 | Attending: Emergency Medicine | Admitting: Emergency Medicine

## 2015-01-27 ENCOUNTER — Emergency Department: Payer: 59

## 2015-01-27 DIAGNOSIS — Y9289 Other specified places as the place of occurrence of the external cause: Secondary | ICD-10-CM | POA: Diagnosis not present

## 2015-01-27 DIAGNOSIS — Z79899 Other long term (current) drug therapy: Secondary | ICD-10-CM | POA: Insufficient documentation

## 2015-01-27 DIAGNOSIS — Z793 Long term (current) use of hormonal contraceptives: Secondary | ICD-10-CM | POA: Insufficient documentation

## 2015-01-27 DIAGNOSIS — Z720 Tobacco use: Secondary | ICD-10-CM | POA: Diagnosis not present

## 2015-01-27 DIAGNOSIS — G8929 Other chronic pain: Secondary | ICD-10-CM

## 2015-01-27 DIAGNOSIS — S92911A Unspecified fracture of right toe(s), initial encounter for closed fracture: Secondary | ICD-10-CM

## 2015-01-27 DIAGNOSIS — Y9389 Activity, other specified: Secondary | ICD-10-CM | POA: Diagnosis not present

## 2015-01-27 DIAGNOSIS — S99921A Unspecified injury of right foot, initial encounter: Secondary | ICD-10-CM | POA: Diagnosis present

## 2015-01-27 DIAGNOSIS — S92514A Nondisplaced fracture of proximal phalanx of right lesser toe(s), initial encounter for closed fracture: Secondary | ICD-10-CM | POA: Diagnosis not present

## 2015-01-27 DIAGNOSIS — W228XXA Striking against or struck by other objects, initial encounter: Secondary | ICD-10-CM | POA: Insufficient documentation

## 2015-01-27 DIAGNOSIS — Y998 Other external cause status: Secondary | ICD-10-CM | POA: Diagnosis not present

## 2015-01-27 DIAGNOSIS — Z792 Long term (current) use of antibiotics: Secondary | ICD-10-CM | POA: Insufficient documentation

## 2015-01-27 DIAGNOSIS — Z7982 Long term (current) use of aspirin: Secondary | ICD-10-CM | POA: Diagnosis not present

## 2015-01-27 DIAGNOSIS — E119 Type 2 diabetes mellitus without complications: Secondary | ICD-10-CM | POA: Diagnosis not present

## 2015-01-27 DIAGNOSIS — Z880 Allergy status to penicillin: Secondary | ICD-10-CM | POA: Insufficient documentation

## 2015-01-27 DIAGNOSIS — M545 Low back pain: Secondary | ICD-10-CM | POA: Insufficient documentation

## 2015-01-27 LAB — URINALYSIS COMPLETE WITH MICROSCOPIC (ARMC ONLY)
Bacteria, UA: NONE SEEN
Bilirubin Urine: NEGATIVE
Glucose, UA: NEGATIVE mg/dL
Ketones, ur: NEGATIVE mg/dL
Leukocytes, UA: NEGATIVE
Nitrite: NEGATIVE
PROTEIN: NEGATIVE mg/dL
Specific Gravity, Urine: 1.01 (ref 1.005–1.030)
pH: 6 (ref 5.0–8.0)

## 2015-01-27 MED ORDER — TRAMADOL HCL 50 MG PO TABS: 50.0000 mg | ORAL_TABLET | Freq: Four times a day (QID) | ORAL | Status: DC | PRN

## 2015-01-27 MED ORDER — IBUPROFEN 800 MG PO TABS
800.0000 mg | ORAL_TABLET | Freq: Three times a day (TID) | ORAL | Status: DC
Start: 2015-01-27 — End: 2015-03-03

## 2015-01-27 NOTE — ED Notes (Signed)
Pt stubbed right foot yesterday, pt complains of right toe pain

## 2015-01-27 NOTE — ED Provider Notes (Signed)
[facility] Emergency Department Provider Note ____________________________________________  Time seen: Approximately 3:58 PM  I have reviewed the triage vital signs and the nursing notes.   HISTORY  Chief Complaint Toe Pain   HPI Christy Mcbride is a 54 y.o. female patient is here with complaint of right foot pain. Patient states that she stumped her toe yesterday and continues to have pain today. Mainly the fourth and fifth toe. She denies any previous injury to her foot. She states that she was already planed come to the emergency room today for low back pain. She states she used to be a patient at the pain clinic here where she saw Dr. crisp. She has not seen him in several years. There is been no injury to her back. She denies any paresthesias. She denies any urinary symptoms other than occasional frequency. There is been no incontinence of bowel or bladder. Currently she states her pain is 9 out of 10. Patient states that she drove herself to the emergency room.   Past Medical History  Diagnosis Date  . Diabetes mellitus without complication   . Thyroid disease     There are no active problems to display for this patient.   Past Surgical History  Procedure Laterality Date  . Ablation    . Cholecystectomy    . Cesarean section    . Liver biopsy      Current Outpatient Rx  Name  Route  Sig  Dispense  Refill  . aspirin 81 MG chewable tablet   Oral   Chew 324 mg by mouth once.         . clindamycin (CLEOCIN) 150 MG capsule   Oral   Take 1 capsule (150 mg total) by mouth 3 (three) times daily.   30 capsule   0   . diazepam (VALIUM) 5 MG tablet   Oral   Take 5 mg by mouth 2 (two) times daily as needed. For anxiety and sleep         . Estradiol-Norethindrone Acet 0.5-0.1 MG per tablet   Oral   Take 1 tablet by mouth daily.         . fentaNYL (SUBLIMAZE) 100 MCG/2ML injection   Intravenous   Inject 150 mcg into the vein once.          . hydrOXYzine (VISTARIL) 25 MG capsule   Oral   Take 1 capsule (25 mg total) by mouth 3 (three) times daily as needed.   30 capsule   0   . ibuprofen (ADVIL,MOTRIN) 800 MG tablet   Oral   Take 1 tablet (800 mg total) by mouth 3 (three) times daily.   30 tablet   0   . lindane lotion (KWELL) 1 %   Topical   Apply 1 application topically once. At bedtime, wash off in the am.   30 mL   0   . nitroGLYCERIN (NITROSTAT) 0.4 MG SL tablet   Sublingual   Place 0.4 mg under the tongue every 5 (five) minutes as needed for chest pain.         Marland Kitchen omeprazole (PRILOSEC) 20 MG capsule   Oral   Take 1 capsule (20 mg total) by mouth daily.   30 capsule   0   . ondansetron (ZOFRAN) 4 MG tablet   Oral   Take 1 tablet (4 mg total) by mouth every 6 (six) hours.   12 tablet   0   . ranitidine (ZANTAC) 150 MG tablet  Oral   Take 1 tablet (150 mg total) by mouth 2 (two) times daily.   14 tablet   1   . sucralfate (CARAFATE) 1 G tablet   Oral   Take 1 tablet (1 g total) by mouth 4 (four) times daily -  with meals and at bedtime.   30 tablet   0   . traMADol (ULTRAM) 50 MG tablet   Oral   Take 1 tablet (50 mg total) by mouth every 6 (six) hours as needed.   20 tablet   0     Allergies Sulfa antibiotics; Codeine; Fentanyl; Penicillins; and Topiramate  No family history on file.  Social History Social History  Substance Use Topics  . Smoking status: Current Some Day Smoker    Types: Cigarettes  . Smokeless tobacco: None  . Alcohol Use: Yes     Comment: occas.    Review of Systems Constitutional: No fever/chills Eyes: No visual changes. ENT: No sore throat. Cardiovascular: Denies chest pain. Respiratory: Denies shortness of breath. Gastrointestinal: No abdominal pain.  No nausea, no vomiting.  No diarrhea.  No constipation. Genitourinary: Occasional frequency. Musculoskeletal: Positive for back pain. Skin: Negative for rash. Neurological: Negative for headaches,  focal weakness or numbness.  10-point ROS otherwise negative.  ____________________________________________   PHYSICAL EXAM:  VITAL SIGNS: ED Triage Vitals  Enc Vitals Group     BP 01/27/15 1529 152/92 mmHg     Pulse Rate 01/27/15 1529 100     Resp 01/27/15 1529 20     Temp 01/27/15 1529 98.9 F (37.2 C)     Temp Source 01/27/15 1529 Oral     SpO2 01/27/15 1529 98 %     Weight 01/27/15 1529 150 lb (68.04 kg)     Height 01/27/15 1529 5\' 2"  (1.575 m)     Head Cir --      Peak Flow --      Pain Score 01/27/15 1525 9     Pain Loc --      Pain Edu? --      Excl. in GC? --     Constitutional: Alert and oriented. Well appearing and in no acute distress. Eyes: Conjunctivae are normal. PERRL. EOMI. Head: Atraumatic. Nose: No congestion/rhinnorhea. Neck: No stridor.   Cardiovascular: Normal rate, regular rhythm. Grossly normal heart sounds.  Good peripheral circulation. Respiratory: Normal respiratory effort.  No retractions. Lungs CTAB. Gastrointestinal: Soft and nontender. No distention. No abdominal bruits. No CVA tenderness. Musculoskeletal: Back exam: No gross deformity. Range of motion is without restriction. There is generalized tenderness on palpation of the lumbar spine and paravertebral muscles more on the left than the right. No lower extremity tenderness nor edema.  No joint effusions. Neurologic:  Normal speech and language. No gross focal neurologic deficits are appreciated. No gait instability. Skin:  Skin is warm, dry and intact. No rash noted. Psychiatric: Mood and affect are normal. Speech and behavior are normal.  ____________________________________________   LABS (all labs ordered are listed, but only abnormal results are displayed)  Labs Reviewed  URINALYSIS COMPLETEWITH MICROSCOPIC ([facility] ONLY) - Abnormal; Notable for the following:    Color, Urine YELLOW (*)    APPearance CLEAR (*)    Hgb urine dipstick 1+ (*)    Squamous Epithelial / LPF 0-5 (*)     All other components within normal limits     PROCEDURES  Procedure(s) performed: None  Critical Care performed: No  ____________________________________________   INITIAL IMPRESSION / ASSESSMENT AND PLAN /  ED COURSE  Pertinent labs & imaging results that were available during my care of the patient were reviewed by me and considered in my medical decision making (see chart for details).  Patient was placed in a wooden shoe after her fourth and fifth toe were buddy taped. Patient was given a prescription for tramadol and ibuprofen. She is to follow-up with Dr. Alberteen Spindle for her foot pain and make an appointment with Dr Metta Clines for her recurrent low back pain. ____________________________________________   FINAL CLINICAL IMPRESSION(S) / ED DIAGNOSES  Final diagnoses:  Fracture, toe, right, closed, initial encounter  Acute exacerbation of chronic low back pain      Tommi Rumps, PA-C 01/27/15 1929  Emily Filbert, MD 01/27/15 [phone]

## 2015-02-11 ENCOUNTER — Emergency Department
Admission: EM | Admit: 2015-02-11 | Discharge: 2015-02-11 | Disposition: A | Discharge status: Home / Self Care | Payer: 59 | Attending: Emergency Medicine | Admitting: Emergency Medicine

## 2015-02-11 ENCOUNTER — Encounter: Payer: Self-pay | Admitting: Emergency Medicine

## 2015-02-11 DIAGNOSIS — Z79891 Long term (current) use of opiate analgesic: Secondary | ICD-10-CM | POA: Insufficient documentation

## 2015-02-11 DIAGNOSIS — Z720 Tobacco use: Secondary | ICD-10-CM | POA: Insufficient documentation

## 2015-02-11 DIAGNOSIS — Z7982 Long term (current) use of aspirin: Secondary | ICD-10-CM | POA: Diagnosis not present

## 2015-02-11 DIAGNOSIS — Z880 Allergy status to penicillin: Secondary | ICD-10-CM | POA: Diagnosis not present

## 2015-02-11 DIAGNOSIS — Z791 Long term (current) use of non-steroidal anti-inflammatories (NSAID): Secondary | ICD-10-CM | POA: Insufficient documentation

## 2015-02-11 DIAGNOSIS — R51 Headache: Secondary | ICD-10-CM | POA: Diagnosis present

## 2015-02-11 DIAGNOSIS — E119 Type 2 diabetes mellitus without complications: Secondary | ICD-10-CM | POA: Diagnosis not present

## 2015-02-11 DIAGNOSIS — G43001 Migraine without aura, not intractable, with status migrainosus: Secondary | ICD-10-CM | POA: Insufficient documentation

## 2015-02-11 DIAGNOSIS — Z792 Long term (current) use of antibiotics: Secondary | ICD-10-CM | POA: Diagnosis not present

## 2015-02-11 DIAGNOSIS — G43901 Migraine, unspecified, not intractable, with status migrainosus: Secondary | ICD-10-CM

## 2015-02-11 DIAGNOSIS — Z79899 Other long term (current) drug therapy: Secondary | ICD-10-CM | POA: Insufficient documentation

## 2015-02-11 MED ORDER — PREDNISONE 20 MG PO TABS
40.0000 mg | ORAL_TABLET | Freq: Once | ORAL | Status: AC
  Administered 2015-02-11: 40 mg via ORAL
  Filled 2015-02-11: qty 2

## 2015-02-11 MED ORDER — NAPROXEN 500 MG PO TABS: 500.0000 mg | ORAL_TABLET | Freq: Two times a day (BID) | ORAL | Status: DC

## 2015-02-11 MED ORDER — SUMATRIPTAN SUCCINATE 6 MG/0.5ML ~~LOC~~ SOLN
6.0000 mg | Freq: Once | SUBCUTANEOUS | Status: AC
  Administered 2015-02-11: 6 mg via SUBCUTANEOUS
  Filled 2015-02-11: qty 0.5

## 2015-02-11 MED ORDER — METOCLOPRAMIDE HCL 10 MG PO TABS: 10.0000 mg | ORAL_TABLET | Freq: Three times a day (TID) | ORAL | Status: DC

## 2015-02-11 MED ORDER — DIPHENHYDRAMINE HCL 25 MG PO CAPS
50.0000 mg | ORAL_CAPSULE | Freq: Once | ORAL | Status: AC
  Administered 2015-02-11: 50 mg via ORAL
  Filled 2015-02-11: qty 2

## 2015-02-11 MED ORDER — DIPHENHYDRAMINE HCL 25 MG PO CAPS
50.0000 mg | ORAL_CAPSULE | Freq: Four times a day (QID) | ORAL | Status: DC | PRN
Start: 2015-02-11 — End: 2015-03-03

## 2015-02-11 MED ORDER — METOCLOPRAMIDE HCL 10 MG PO TABS
10.0000 mg | ORAL_TABLET | Freq: Once | ORAL | Status: AC
Start: 2015-02-11 — End: 2015-02-11
  Administered 2015-02-11: 10 mg via ORAL
  Filled 2015-02-11: qty 1

## 2015-02-11 NOTE — Discharge Instructions (Signed)

## 2015-02-11 NOTE — ED Notes (Signed)
Pt reports this is like her normal migraine but has lasted longer. Pt reports headache since Friday

## 2015-02-11 NOTE — ED Provider Notes (Signed)
[facility] Emergency Department Provider Note  ____________________________________________  Time seen: 8:15 AM  I have reviewed the triage vital signs and the nursing notes.   HISTORY  Chief Complaint Migraine    HPI Joshua Tarr is a 54 y.o. female who complains of a headache for 4 days. It is gradual in onset and progressively worsening, constant, diffuse headache bandlike pressure around the whole head. No hearing or vision changes. No numbness tingling or weakness. She's been nauseated but without vomiting. No chest pain shortness of breath abdominal pain fevers or chills. No neck pain or stiffness. She's had decreased oral intake due to nausea feels exactly like her history of migraine headaches without any unusual factors. She tried taking some tramadol at home that she has without relief. In the setting of several days of decreased oral intake, today she felt a little bit dizzy and lightheaded which got better when she laid down and rested. No syncope or falls or head injury.     Past Medical History  Diagnosis Date  . Diabetes mellitus without complication   . Thyroid disease      There are no active problems to display for this patient.    Past Surgical History  Procedure Laterality Date  . Ablation    . Cholecystectomy    . Cesarean section    . Liver biopsy       Current Outpatient Rx  Name  Route  Sig  Dispense  Refill  . aspirin 81 MG chewable tablet   Oral   Chew 324 mg by mouth once.         . clindamycin (CLEOCIN) 150 MG capsule   Oral   Take 1 capsule (150 mg total) by mouth 3 (three) times daily.   30 capsule   0   . diazepam (VALIUM) 5 MG tablet   Oral   Take 5 mg by mouth 2 (two) times daily as needed. For anxiety and sleep         . Estradiol-Norethindrone Acet 0.5-0.1 MG per tablet   Oral   Take 1 tablet by mouth daily.         . fentaNYL (SUBLIMAZE) 100 MCG/2ML injection   Intravenous   Inject 150  mcg into the vein once.         . hydrOXYzine (VISTARIL) 25 MG capsule   Oral   Take 1 capsule (25 mg total) by mouth 3 (three) times daily as needed.   30 capsule   0   . ibuprofen (ADVIL,MOTRIN) 800 MG tablet   Oral   Take 1 tablet (800 mg total) by mouth 3 (three) times daily.   30 tablet   0   . lindane lotion (KWELL) 1 %   Topical   Apply 1 application topically once. At bedtime, wash off in the am.   30 mL   0   . nitroGLYCERIN (NITROSTAT) 0.4 MG SL tablet   Sublingual   Place 0.4 mg under the tongue every 5 (five) minutes as needed for chest pain.         Marland Kitchen omeprazole (PRILOSEC) 20 MG capsule   Oral   Take 1 capsule (20 mg total) by mouth daily.   30 capsule   0   . ondansetron (ZOFRAN) 4 MG tablet   Oral   Take 1 tablet (4 mg total) by mouth every 6 (six) hours.   12 tablet   0   . ranitidine (ZANTAC) 150 MG tablet  Oral   Take 1 tablet (150 mg total) by mouth 2 (two) times daily.   14 tablet   1   . sucralfate (CARAFATE) 1 G tablet   Oral   Take 1 tablet (1 g total) by mouth 4 (four) times daily -  with meals and at bedtime.   30 tablet   0   . traMADol (ULTRAM) 50 MG tablet   Oral   Take 1 tablet (50 mg total) by mouth every 6 (six) hours as needed.   20 tablet   0      Allergies Sulfa antibiotics; Codeine; Fentanyl; Penicillins; and Topiramate   No family history on file.  Social History Social History  Substance Use Topics  . Smoking status: Current Some Day Smoker    Types: Cigarettes  . Smokeless tobacco: None  . Alcohol Use: Yes     Comment: occas.    Review of Systems  Constitutional:   No fever or chills. No weight changes Eyes:   No blurry vision or double vision.  ENT:   No sore throat. Cardiovascular:   No chest pain. Respiratory:   No dyspnea or cough. Gastrointestinal:   Negative for abdominal pain, vomiting and diarrhea.  No BRBPR or melena. Genitourinary:   Negative for dysuria, urinary retention, bloody  urine, or difficulty urinating. Musculoskeletal:   Negative for back pain. No joint swelling or pain. Skin:   Negative for rash. Neurological:   Positive for headache consistent with history of migraine headaches. No focal paresthesia or weakness.Marland Kitchen Psychiatric:  No anxiety or depression.   Endocrine:  No hot/cold intolerance, changes in energy, or sleep difficulty.  10-point ROS otherwise negative.  ____________________________________________   PHYSICAL EXAM:  VITAL SIGNS: ED Triage Vitals  Enc Vitals Group     BP 02/11/15 0803 134/69 mmHg     Pulse Rate 02/11/15 0803 46     Resp 02/11/15 0803 18     Temp 02/11/15 0803 98.2 F (36.8 C)     Temp Source 02/11/15 0803 Oral     SpO2 02/11/15 0803 96 %     Weight 02/11/15 0801 150 lb (68.04 kg)     Height 02/11/15 0801 5\' 2"  (1.575 m)     Head Cir --      Peak Flow --      Pain Score 02/11/15 0801 9     Pain Loc --      Pain Edu? --      Excl. in GC? --      Constitutional:   Alert and oriented. Well appearing and in no distress. Eyes:   No scleral icterus. No conjunctival pallor. PERRL. EOMI ENT   Head:   Normocephalic and atraumatic.   Nose:   No congestion/rhinnorhea. No septal hematoma   Mouth/Throat:   MMM, no pharyngeal erythema. No peritonsillar mass. No uvula shift.   Neck:   No stridor. No SubQ emphysema. No meningismus. Hematological/Lymphatic/Immunilogical:   No cervical lymphadenopathy. Cardiovascular:   RRR. Normal and symmetric distal pulses are present in all extremities. No murmurs, rubs, or gallops. Respiratory:   Normal respiratory effort without tachypnea nor retractions. Breath sounds are clear and equal bilaterally. No wheezes/rales/rhonchi. Gastrointestinal:   Soft and nontender. No distention. There is no CVA tenderness.  No rebound, rigidity, or guarding. Genitourinary:   deferred Musculoskeletal:   Nontender with normal range of motion in all extremities. No joint effusions.  No lower  extremity tenderness.  No edema. Neurologic:   Normal speech and  language.  CN 2-10 normal. Motor grossly intact. No pronator drift.  Normal gait. No gross focal neurologic deficits are appreciated.  Skin:    Skin is warm, dry and intact. No rash noted.  No petechiae, purpura, or bullae. Psychiatric:   Mood and affect are normal. Speech and behavior are normal. Patient exhibits appropriate insight and judgment.  ____________________________________________    LABS (pertinent positives/negatives) (all labs ordered are listed, but only abnormal results are displayed) Labs Reviewed - No data to display ____________________________________________   EKG    ____________________________________________    RADIOLOGY    ____________________________________________   PROCEDURES   ____________________________________________   INITIAL IMPRESSION / ASSESSMENT AND PLAN / ED COURSE  Pertinent labs & imaging results that were available during my care of the patient were reviewed by me and considered in my medical decision making (see chart for details).  Patient presents with headache which is not concerning for any acute pathology such as stroke intracranial hemorrhage meningitis and encephalitis, glaucoma giant cell arteritis or pseudotumor. It is entirely consistent with her history of migraines. We'll give her sumatriptan and Reglan and Benadryl and a dose of steroids and have her follow-up with her doctor.     ____________________________________________   FINAL CLINICAL IMPRESSION(S) / ED DIAGNOSES  Final diagnoses:  Migraine with status migrainosus, not intractable, unspecified migraine type      Sharman Cheek, MD 02/11/15 [id number]

## 2015-02-11 NOTE — ED Notes (Signed)
Pt with migraine since Friday, hx of same

## 2015-02-19 ENCOUNTER — Emergency Department: Payer: 59

## 2015-02-19 ENCOUNTER — Emergency Department
Admission: EM | Admit: 2015-02-19 | Discharge: 2015-02-19 | Disposition: A | Discharge status: Home / Self Care | Payer: 59 | Attending: Emergency Medicine | Admitting: Emergency Medicine

## 2015-02-19 ENCOUNTER — Encounter: Payer: Self-pay | Admitting: Emergency Medicine

## 2015-02-19 DIAGNOSIS — Z791 Long term (current) use of non-steroidal anti-inflammatories (NSAID): Secondary | ICD-10-CM | POA: Insufficient documentation

## 2015-02-19 DIAGNOSIS — Z792 Long term (current) use of antibiotics: Secondary | ICD-10-CM | POA: Diagnosis not present

## 2015-02-19 DIAGNOSIS — Z7982 Long term (current) use of aspirin: Secondary | ICD-10-CM | POA: Diagnosis not present

## 2015-02-19 DIAGNOSIS — Z79899 Other long term (current) drug therapy: Secondary | ICD-10-CM | POA: Diagnosis not present

## 2015-02-19 DIAGNOSIS — R1084 Generalized abdominal pain: Secondary | ICD-10-CM | POA: Diagnosis not present

## 2015-02-19 DIAGNOSIS — Z87891 Personal history of nicotine dependence: Secondary | ICD-10-CM | POA: Diagnosis not present

## 2015-02-19 DIAGNOSIS — E119 Type 2 diabetes mellitus without complications: Secondary | ICD-10-CM | POA: Diagnosis not present

## 2015-02-19 DIAGNOSIS — N939 Abnormal uterine and vaginal bleeding, unspecified: Secondary | ICD-10-CM | POA: Insufficient documentation

## 2015-02-19 DIAGNOSIS — Z880 Allergy status to penicillin: Secondary | ICD-10-CM | POA: Insufficient documentation

## 2015-02-19 DIAGNOSIS — R109 Unspecified abdominal pain: Secondary | ICD-10-CM

## 2015-02-19 DIAGNOSIS — Z3202 Encounter for pregnancy test, result negative: Secondary | ICD-10-CM | POA: Insufficient documentation

## 2015-02-19 HISTORY — DX: Liver disease, unspecified: K76.9

## 2015-02-19 HISTORY — DX: Other cervical disc displacement, unspecified cervical region: M50.20

## 2015-02-19 HISTORY — DX: Essential (primary) hypertension: I10

## 2015-02-19 LAB — COMPREHENSIVE METABOLIC PANEL
ALK PHOS: 78 U/L (ref 38–126)
ALT: 41 U/L (ref 14–54)
ANION GAP: 8 (ref 5–15)
AST: 53 U/L — ABNORMAL HIGH (ref 15–41)
Albumin: 4 g/dL (ref 3.5–5.0)
BILIRUBIN TOTAL: 0.5 mg/dL (ref 0.3–1.2)
BUN: 9 mg/dL (ref 6–20)
CALCIUM: 9.4 mg/dL (ref 8.9–10.3)
CO2: 29 mmol/L (ref 22–32)
Chloride: 104 mmol/L (ref 101–111)
Creatinine, Ser: 0.71 mg/dL (ref 0.44–1.00)
GFR calc non Af Amer: >60 mL/min (ref >60)
Glucose, Bld: 122 mg/dL — ABNORMAL HIGH (ref 65–99)
Potassium: 4.2 mmol/L (ref 3.5–5.1)
Sodium: 141 mmol/L (ref 135–145)
TOTAL PROTEIN: 6.9 g/dL (ref 6.5–8.1)

## 2015-02-19 LAB — CHLAMYDIA/NGC RT PCR (ARMC ONLY)
Chlamydia Tr: NOT DETECTED
N gonorrhoeae: NOT DETECTED

## 2015-02-19 LAB — CBC
HEMATOCRIT: 40.9 % (ref 35.0–47.0)
HEMOGLOBIN: 12.8 g/dL (ref 12.0–16.0)
MCH: 24.8 pg — ABNORMAL LOW (ref 26.0–34.0)
MCHC: 31.3 g/dL — ABNORMAL LOW (ref 32.0–36.0)
MCV: 79.3 fL — ABNORMAL LOW (ref 80.0–100.0)
Platelets: 298 10*3/uL (ref 150–440)
RBC: 5.16 MIL/uL (ref 3.80–5.20)
RDW: 15.4 % — ABNORMAL HIGH (ref 11.5–14.5)
WBC: 7.6 10*3/uL (ref 3.6–11.0)

## 2015-02-19 LAB — PROTIME-INR
INR: 0.97
PROTHROMBIN TIME: 13.1 s (ref 11.4–15.0)

## 2015-02-19 LAB — TYPE AND SCREEN
ABO/RH(D): A NEG
ANTIBODY SCREEN: NEGATIVE

## 2015-02-19 LAB — ABO/RH: ABO/RH(D): A NEG

## 2015-02-19 LAB — WET PREP, GENITAL
Clue Cells Wet Prep HPF POC: NONE SEEN
TRICH WET PREP: NONE SEEN
Yeast Wet Prep HPF POC: NONE SEEN

## 2015-02-19 LAB — HCG, QUANTITATIVE, PREGNANCY: HCG, BETA CHAIN, QUANT, S: 3 m[IU]/mL (ref <5)

## 2015-02-19 MED ORDER — IOHEXOL 300 MG/ML  SOLN
100.0000 mL | Freq: Once | INTRAMUSCULAR | Status: AC | PRN
  Administered 2015-02-19: 100 mL via INTRAVENOUS

## 2015-02-19 MED ORDER — HYDROMORPHONE HCL 1 MG/ML IJ SOLN
1.0000 mg | Freq: Once | INTRAMUSCULAR | Status: AC
  Administered 2015-02-19: 1 mg via INTRAVENOUS
  Filled 2015-02-19: qty 1

## 2015-02-19 MED ORDER — DIPHENHYDRAMINE HCL 50 MG/ML IJ SOLN
12.5000 mg | Freq: Once | INTRAMUSCULAR | Status: AC
  Administered 2015-02-19: 12.5 mg via INTRAVENOUS
  Filled 2015-02-19: qty 1

## 2015-02-19 MED ORDER — OXYCODONE-ACETAMINOPHEN 5-325 MG PO TABS
1.0000 | ORAL_TABLET | Freq: Once | ORAL | Status: AC
  Administered 2015-02-19: 1 via ORAL

## 2015-02-19 MED ORDER — TRAMADOL HCL 50 MG PO TABS: 50.0000 mg | ORAL_TABLET | Freq: Four times a day (QID) | ORAL | Status: DC | PRN

## 2015-02-19 MED ORDER — IOHEXOL 240 MG/ML SOLN: 25.0000 mL | Freq: Once | INTRAMUSCULAR | Status: DC | PRN

## 2015-02-19 MED ORDER — OXYCODONE-ACETAMINOPHEN 5-325 MG PO TABS
ORAL_TABLET | ORAL | Status: AC
  Filled 2015-02-19: qty 1

## 2015-02-19 NOTE — Discharge Instructions (Signed)

## 2015-02-19 NOTE — ED Provider Notes (Addendum)
[facility] Emergency Department Provider Note  ____________________________________________  Time seen: Approximately 8:23 PM  I have reviewed the triage vital signs and the nursing notes.   HISTORY  Chief Complaint Vaginal Bleeding    HPI Christy Mcbride is a 54 y.o. female with a history of diabetes, thyroid disease and "liver disease" who states that she has not seen a doctor for any of this for the last 5 years. Patient states that she has had lower pelvic cramping pain and occasional spotting from her vagina for the last 2 weeks. She denies any fever or chills. Her last postoperative 4 years ago at which time she had an ablation for heavy uterine bleeding. She has had no fever no chills no nausea no vomiting. She denies dysuria or urinary frequency or diarrhea. The patient is getting gradually worse over the last 2 weeks and is now significant. She is not sexually active and has not been for nearly a year. She denies any vaginal discharge.  Past Medical History  Diagnosis Date  . Diabetes mellitus without complication   . Thyroid disease   . Liver disease   . Herniated cervical disc     There are no active problems to display for this patient.   Past Surgical History  Procedure Laterality Date  . Ablation    . Cholecystectomy    . Cesarean section    . Liver biopsy      Current Outpatient Rx  Name  Route  Sig  Dispense  Refill  . aspirin 81 MG chewable tablet   Oral   Chew 324 mg by mouth once.         . clindamycin (CLEOCIN) 150 MG capsule   Oral   Take 1 capsule (150 mg total) by mouth 3 (three) times daily.   30 capsule   0   . diazepam (VALIUM) 5 MG tablet   Oral   Take 5 mg by mouth 2 (two) times daily as needed. For anxiety and sleep         . diphenhydrAMINE (BENADRYL) 25 mg capsule   Oral   Take 2 capsules (50 mg total) by mouth every 6 (six) hours as needed.   60 capsule   0   . Estradiol-Norethindrone Acet 0.5-0.1 MG  per tablet   Oral   Take 1 tablet by mouth daily.         . fentaNYL (SUBLIMAZE) 100 MCG/2ML injection   Intravenous   Inject 150 mcg into the vein once.         . hydrOXYzine (VISTARIL) 25 MG capsule   Oral   Take 1 capsule (25 mg total) by mouth 3 (three) times daily as needed.   30 capsule   0   . ibuprofen (ADVIL,MOTRIN) 800 MG tablet   Oral   Take 1 tablet (800 mg total) by mouth 3 (three) times daily.   30 tablet   0   . lindane lotion (KWELL) 1 %   Topical   Apply 1 application topically once. At bedtime, wash off in the am.   30 mL   0   . metoCLOPramide (REGLAN) 10 MG tablet   Oral   Take 1 tablet (10 mg total) by mouth 4 (four) times daily -  before meals and at bedtime.   60 tablet   0   . naproxen (NAPROSYN) 500 MG tablet   Oral   Take 1 tablet (500 mg total) by mouth 2 (two) times daily  with a meal.   20 tablet   0   . nitroGLYCERIN (NITROSTAT) 0.4 MG SL tablet   Sublingual   Place 0.4 mg under the tongue every 5 (five) minutes as needed for chest pain.         Marland Kitchen omeprazole (PRILOSEC) 20 MG capsule   Oral   Take 1 capsule (20 mg total) by mouth daily.   30 capsule   0   . ondansetron (ZOFRAN) 4 MG tablet   Oral   Take 1 tablet (4 mg total) by mouth every 6 (six) hours.   12 tablet   0   . ranitidine (ZANTAC) 150 MG tablet   Oral   Take 1 tablet (150 mg total) by mouth 2 (two) times daily.   14 tablet   1   . sucralfate (CARAFATE) 1 G tablet   Oral   Take 1 tablet (1 g total) by mouth 4 (four) times daily -  with meals and at bedtime.   30 tablet   0   . traMADol (ULTRAM) 50 MG tablet   Oral   Take 1 tablet (50 mg total) by mouth every 6 (six) hours as needed.   20 tablet   0     Allergies Sulfa antibiotics; Codeine; Fentanyl; Penicillins; and Topiramate  No family history on file.  Social History Social History  Substance Use Topics  . Smoking status: Former Smoker    Types: Cigarettes  . Smokeless tobacco: None   . Alcohol Use: Yes     Comment: occas.    Review of Systems Constitutional: No fever/chills Eyes: No visual changes. ENT: No sore throat. No stiff neck no neck pain Cardiovascular: Denies chest pain. Respiratory: Denies shortness of breath. Gastrointestinal:   no vomiting.  No diarrhea.  No constipation. Genitourinary: Negative for dysuria. Musculoskeletal: Negative for back pain. Skin: Negative for rash. Neurological: Negative for headaches, focal weakness or numbness. 10-point ROS otherwise negative.  ____________________________________________   PHYSICAL EXAM:  VITAL SIGNS: ED Triage Vitals  Enc Vitals Group     BP 02/19/15 1456 168/93 mmHg     Pulse Rate 02/19/15 1456 87     Resp 02/19/15 1456 16     Temp 02/19/15 1456 98.1 F (36.7 C)     Temp Source 02/19/15 1456 Oral     SpO2 02/19/15 1456 97 %     Weight 02/19/15 1456 150 lb (68.04 kg)     Height 02/19/15 1456  (1.575 m)     Head Cir --      Peak Flow --      Pain Score 02/19/15 1456 10     Pain Loc --      Pain Edu? --      Excl. in GC? --     Constitutional: Alert and oriented. Well appearing and in no acute distress. Eyes: Conjunctivae are normal. PERRL. EOMI. Head: Atraumatic. Nose: No congestion/rhinnorhea. Mouth/Throat: Mucous membranes are moist.  Oropharynx non-erythematous. Neck: No stridor.   Nontender with no meningismus Cardiovascular: Normal rate, regular rhythm. Grossly normal heart sounds.  Good peripheral circulation. Respiratory: Normal respiratory effort.  No retractions. Lungs CTAB. Gastrointestinal: Diffusely tender mostly in the pelvic region no guarding no rebound. No distention. No abdominal bruits. No CVA tenderness. Pelvic exam: Female nurse chaperone present, no lesions noted no vaginal discharge, no bleeding, there is no adnexal masses there is no uterine masses, there is uterine tenderness. No CMT Musculoskeletal: No lower extremity tenderness. No joint effusions, no  DVT  signs strong distal pulses no edema Neurologic:  Normal speech and language. No gross focal neurologic deficits are appreciated.  Skin:  Skin is warm, dry and intact. No rash noted. Psychiatric: Mood and affect are normal. Speech and behavior are normal.  ____________________________________________   LABS (all labs ordered are listed, but only abnormal results are displayed)  Labs Reviewed  WET PREP, GENITAL - Abnormal; Notable for the following:    WBC, Wet Prep HPF POC MODERATE (*)    All other components within normal limits  CBC - Abnormal; Notable for the following:    MCV 79.3 (*)    MCH 24.8 (*)    MCHC 31.3 (*)    RDW 15.4 (*)    All other components within normal limits  COMPREHENSIVE METABOLIC PANEL - Abnormal; Notable for the following:    Glucose, Bld 122 (*)    AST 53 (*)    All other components within normal limits  CHLAMYDIA/NGC RT PCR ([facility] ONLY)  HCG, QUANTITATIVE, PREGNANCY  PROTIME-INR  POC URINE PREG, ED  TYPE AND SCREEN  ABO/RH   ____________________________________________  EKG   ____________________________________________  RADIOLOGY  Results reviewed ____________________________________________   PROCEDURES  Procedure(s) performed: None  Critical Care performed: None  ____________________________________________   INITIAL IMPRESSION / ASSESSMENT AND PLAN / ED COURSE  Pertinent labs & imaging results that were available during my care of the patient were reviewed by me and considered in my medical decision making (see chart for details).  Patient with spotting in her vagina despite not having had a period for the last 4 years. Also lower abdominal cramping discomfort. Her abdomen is nonsurgical. CT scan is been ordered to further evaluate this patient's pain which she describes as significant. Her ultrasound and blood work are reassuring as is her pelvic exam. Nothing to suggest PID at this  time ____________________________________________  ----------------------------------------- 9:40 PM on 02/19/2015 -----------------------------------------  Patient remains in no acute distress. She did have some itching but no hives or respiratory issues after the Dilaudid. Thus far, I am not seeing any obvious pathology to account for the patient's symptoms. She may need outpatient follow-up. If CT is negative, we will discharge her home with close outpatient follow-up. She does not have a surgical abdomen, serial abdominal exams betray some lower abdominal discomfort but no evidence of guarding or rebound or rigidity. Patient is trouble with this plan and the itching is resolved after Benadryl. I did defer IV contrast however because I did not want to give the patient was having some itching IV contrast if she developed an allergic reaction I would not know the source.   ----------------------------------------- 10:18 PM on 02/19/2015 -----------------------------------------  Patient is well appearing. She states that she has been anxious recently but has no SI or HI. She does not feel depressed. She would like to leave for pain. She has her back pain medication. This is her second visit in 10 days for different pain complaints. I have instructed her that I will not send her home with a large dose of pain medication but I will send her with a few pills because she states she is still having abdominal discomfort. He'll use tramadol given that it is less of an addictive profile medication. Return precautions and follow stressed and understood. FINAL CLINICAL IMPRESSION(S) / ED DIAGNOSES  Final diagnoses:  Vaginal bleeding  Abdominal pain     Jeanmarie Plant, MD 02/19/15 2141  Jeanmarie Plant, MD 02/19/15 2219

## 2015-02-19 NOTE — ED Notes (Signed)
Pt c/o generalize itching after dilaudid administration, Dr. Alphonzo Lemmings notified.

## 2015-02-19 NOTE — ED Notes (Signed)
Pt reports vaginal bleeding (spotting) x2 weeks, reports LMP was 4 years ago after ablation. Pt reports lower abdominal cramping and pain for past week and now with headache.

## 2015-02-19 NOTE — ED Notes (Signed)
Negative POCT pregnancy test.

## 2015-03-03 ENCOUNTER — Encounter: Payer: Self-pay | Admitting: Emergency Medicine

## 2015-03-03 ENCOUNTER — Emergency Department: Payer: 59

## 2015-03-03 ENCOUNTER — Emergency Department
Admission: EM | Admit: 2015-03-03 | Discharge: 2015-03-03 | Disposition: A | Discharge status: Home / Self Care | Payer: 59 | Attending: Emergency Medicine | Admitting: Emergency Medicine

## 2015-03-03 DIAGNOSIS — Z79890 Hormone replacement therapy (postmenopausal): Secondary | ICD-10-CM | POA: Diagnosis not present

## 2015-03-03 DIAGNOSIS — Z79899 Other long term (current) drug therapy: Secondary | ICD-10-CM | POA: Diagnosis not present

## 2015-03-03 DIAGNOSIS — E119 Type 2 diabetes mellitus without complications: Secondary | ICD-10-CM | POA: Insufficient documentation

## 2015-03-03 DIAGNOSIS — Z880 Allergy status to penicillin: Secondary | ICD-10-CM | POA: Insufficient documentation

## 2015-03-03 DIAGNOSIS — I10 Essential (primary) hypertension: Secondary | ICD-10-CM | POA: Insufficient documentation

## 2015-03-03 DIAGNOSIS — M25521 Pain in right elbow: Secondary | ICD-10-CM | POA: Diagnosis not present

## 2015-03-03 DIAGNOSIS — Z7982 Long term (current) use of aspirin: Secondary | ICD-10-CM | POA: Diagnosis not present

## 2015-03-03 DIAGNOSIS — Z87891 Personal history of nicotine dependence: Secondary | ICD-10-CM | POA: Insufficient documentation

## 2015-03-03 MED ORDER — BACLOFEN 10 MG PO TABS: 10.0000 mg | ORAL_TABLET | Freq: Three times a day (TID) | ORAL | Status: DC

## 2015-03-03 MED ORDER — IBUPROFEN 800 MG PO TABS: 800.0000 mg | ORAL_TABLET | Freq: Three times a day (TID) | ORAL | Status: DC | PRN

## 2015-03-03 MED ORDER — OXYCODONE-ACETAMINOPHEN 5-325 MG PO TABS: 1.0000 | ORAL_TABLET | ORAL | Status: DC | PRN

## 2015-03-03 MED ORDER — KETOROLAC TROMETHAMINE 60 MG/2ML IM SOLN
60.0000 mg | Freq: Once | INTRAMUSCULAR | Status: AC
  Administered 2015-03-03: 60 mg via INTRAMUSCULAR
  Filled 2015-03-03: qty 2

## 2015-03-03 NOTE — Discharge Instructions (Signed)
Arthralgia °Your caregiver has diagnosed you as suffering from an arthralgia. Arthralgia means there is pain in a joint. This can come from many reasons including: °· Bruising the joint which causes soreness (inflammation) in the joint. °· Wear and tear on the joints which occur as we grow older (osteoarthritis). °· Overusing the joint. °· Various forms of arthritis. °· Infections of the joint. °Regardless of the cause of pain in your joint, most of these different pains respond to anti-inflammatory drugs and rest. The exception to this is when a joint is infected, and these cases are treated with antibiotics, if it is a bacterial infection. °HOME CARE INSTRUCTIONS  °· Rest the injured area for as long as directed by your caregiver. Then slowly start using the joint as directed by your caregiver and as the pain allows. Crutches as directed may be useful if the ankles, knees or hips are involved. If the knee was splinted or casted, continue use and care as directed. If an stretchy or elastic wrapping bandage has been applied today, it should be removed and re-applied every 3 to 4 hours. It should not be applied tightly, but firmly enough to keep swelling down. Watch toes and feet for swelling, bluish discoloration, coldness, numbness or excessive pain. If any of these problems (symptoms) occur, remove the ace bandage and re-apply more loosely. If these symptoms persist, contact your caregiver or return to this location. °· For the first 24 hours, keep the injured extremity elevated on pillows while lying down. °· Apply ice for 15-20 minutes to the sore joint every couple hours while awake for the first half day. Then 03-04 times per day for the first 48 hours. Put the ice in a plastic bag and place a towel between the bag of ice and your skin. °· Wear any splinting, casting, elastic bandage applications, or slings as instructed. °· Only take over-the-counter or prescription medicines for pain, discomfort, or fever as  directed by your caregiver. Do not use aspirin immediately after the injury unless instructed by your physician. Aspirin can cause increased bleeding and bruising of the tissues. °· If you were given crutches, continue to use them as instructed and do not resume weight bearing on the sore joint until instructed. °Persistent pain and inability to use the sore joint as directed for more than 2 to 3 days are warning signs indicating that you should see a caregiver for a follow-up visit as soon as possible. Initially, a hairline fracture (break in bone) may not be evident on X-rays. Persistent pain and swelling indicate that further evaluation, non-weight bearing or use of the joint (use of crutches or slings as instructed), or further X-rays are indicated. X-rays may sometimes not show a small fracture until a week or 10 days later. Make a follow-up appointment with your own caregiver or one to whom we have referred you. A radiologist (specialist in reading X-rays) may read your X-rays. Make sure you know how you are to obtain your X-ray results. Do not assume everything is normal if you do not hear from us. °SEEK MEDICAL CARE IF: °Bruising, swelling, or pain increases. °SEEK IMMEDIATE MEDICAL CARE IF:  °· Your fingers or toes are numb or blue. °· The pain is not responding to medications and continues to stay the same or get worse. °· The pain in your joint becomes severe. °· You develop a fever over 102° F (38.9° C). °· It becomes impossible to move or use the joint. °MAKE SURE YOU:  °·   Understand these instructions.  Will watch your condition.  Will get help right away if you are not doing well or get worse. Document Released: 05/18/2005 Document Revised: 08/10/2011 Document Reviewed: 01/04/2008 [facility] Patient Information 2015 [place], [place]. This information is not intended to replace advice given to you by your health care provider. Make sure you discuss any questions you have with your health care  provider.  Heat Therapy Heat therapy can help ease sore, stiff, injured, and tight muscles and joints. Heat relaxes your muscles, which may help ease your pain.  RISKS AND COMPLICATIONS If you have any of the following conditions, do not use heat therapy unless your health care provider has approved:  Poor circulation.  Healing wounds or scarred skin in the area being treated.  Diabetes, heart disease, or high blood pressure.  Not being able to feel (numbness) the area being treated.  Unusual swelling of the area being treated.  Active infections.  Blood clots.  Cancer.  Inability to communicate pain. This may include young children and people who have problems with their brain function (dementia).  Pregnancy. Heat therapy should only be used on old, pre-existing, or long-lasting (chronic) injuries. Do not use heat therapy on new injuries unless directed by your health care provider. HOW TO USE HEAT THERAPY There are several different kinds of heat therapy, including:  Moist heat pack.  Warm water bath.  Hot water bottle.  Electric heating pad.  Heated gel pack.  Heated wrap.  Electric heating pad. Use the heat therapy method suggested by your health care provider. Follow your health care provider's instructions on when and how to use heat therapy. GENERAL HEAT THERAPY RECOMMENDATIONS  Do not sleep while using heat therapy. Only use heat therapy while you are awake.  Your skin may turn pink while using heat therapy. Do not use heat therapy if your skin turns red.  Do not use heat therapy if you have new pain.  High heat or long exposure to heat can cause burns. Be careful when using heat therapy to avoid burning your skin.  Do not use heat therapy on areas of your skin that are already irritated, such as with a rash or sunburn. SEEK MEDICAL CARE IF:  You have blisters, redness, swelling, or numbness.  You have new pain.  Your pain is worse. MAKE SURE  YOU:  Understand these instructions.  Will watch your condition.  Will get help right away if you are not doing well or get worse. Document Released: 08/10/2011 Document Revised: 10/02/2013 Document Reviewed: 07/11/2013 [facility] Patient Information 2015 [place], [place]. This information is not intended to replace advice given to you by your health care provider. Make sure you discuss any questions you have with your health care provider.

## 2015-03-03 NOTE — ED Provider Notes (Signed)
[facility] Emergency Department Provider Note  ____________________________________________  Time seen: Approximately 1:41 PM  I have reviewed the triage vital signs and the nursing notes.   HISTORY  Chief Complaint Arm Pain    HPI Christy Mcbride is a 54 y.o. female who presents for evaluation of right elbow pain times one week. Patient states she knows of no known trauma but complains of pain radiating distally to her fingers and approximately to her neck   Past Medical History  Diagnosis Date  . Diabetes mellitus without complication (HCC)   . Thyroid disease   . Liver disease   . Herniated cervical disc   . Hypertension     There are no active problems to display for this patient.   Past Surgical History  Procedure Laterality Date  . Ablation    . Cholecystectomy    . Cesarean section    . Liver biopsy      Current Outpatient Rx  Name  Route  Sig  Dispense  Refill  . aspirin 81 MG chewable tablet   Oral   Chew 324 mg by mouth once.         . baclofen (LIORESAL) 10 MG tablet   Oral   Take 1 tablet (10 mg total) by mouth 3 (three) times daily.   30 tablet   0   . diazepam (VALIUM) 5 MG tablet   Oral   Take 5 mg by mouth 2 (two) times daily as needed. For anxiety and sleep         . Estradiol-Norethindrone Acet 0.5-0.1 MG per tablet   Oral   Take 1 tablet by mouth daily.         . fentaNYL (SUBLIMAZE) 100 MCG/2ML injection   Intravenous   Inject 150 mcg into the vein once.         Marland Kitchen ibuprofen (ADVIL,MOTRIN) 800 MG tablet   Oral   Take 1 tablet (800 mg total) by mouth every 8 (eight) hours as needed.   30 tablet   0   . metoCLOPramide (REGLAN) 10 MG tablet   Oral   Take 1 tablet (10 mg total) by mouth 4 (four) times daily -  before meals and at bedtime.   60 tablet   0   . nitroGLYCERIN (NITROSTAT) 0.4 MG SL tablet   Sublingual   Place 0.4 mg under the tongue every 5 (five) minutes as needed for chest pain.          Marland Kitchen oxyCODONE-acetaminophen (ROXICET) 5-325 MG tablet   Oral   Take 1-2 tablets by mouth every 4 (four) hours as needed for severe pain.   15 tablet   0     Allergies Sulfa antibiotics; Fentanyl; Penicillins; and Topiramate  No family history on file.  Social History Social History  Substance Use Topics  . Smoking status: Former Smoker    Types: Cigarettes  . Smokeless tobacco: None  . Alcohol Use: Yes     Comment: occas.    Review of Systems Constitutional: No fever/chills Eyes: No visual changes. ENT: No sore throat. Cardiovascular: Denies chest pain. Respiratory: Denies shortness of breath. Gastrointestinal: No abdominal pain.  No nausea, no vomiting.  No diarrhea.  No constipation. Genitourinary: Negative for dysuria.  Musculoskeletal: Negative for back pain. Positive for right elbow pain. Skin: Negative for rash. Neurological: Negative for headaches, focal weakness or numbness.  10-point ROS otherwise negative.  ____________________________________________   PHYSICAL EXAM:  VITAL SIGNS: ED Triage Vitals  Enc Vitals Group     BP 03/03/15 1221 147/80 mmHg     Pulse Rate 03/03/15 1221 98     Resp 03/03/15 1221 18     Temp 03/03/15 1221 98.2 F (36.8 C)     Temp Source 03/03/15 1221 Oral     SpO2 03/03/15 1221 100 %     Weight 03/03/15 1221 150 lb (68.04 kg)     Height 03/03/15 1221  (1.575 m)     Head Cir --      Peak Flow --      Pain Score 03/03/15 1254 8     Pain Loc --      Pain Edu? --      Excl. in GC? --     Constitutional: Alert and oriented. Well appearing and in no acute distress. Eyes: Conjunctivae are normal. PERRL. EOMI. Head: Atraumatic. Nose: No congestion/rhinnorhea. Mouth/Throat: Mucous membranes are moist.  Oropharynx non-erythematous. Neck: No stridor.   Cardiovascular: Normal rate, regular rhythm. Grossly normal heart sounds.  Good peripheral circulation. Respiratory: Normal respiratory effort.  No retractions.  Lungs CTAB. Gastrointestinal: Soft and nontender. No distention. No abdominal bruits. No CVA tenderness. Musculoskeletal: No lower extremity tenderness nor edema.  No joint effusions. Positive for point tenderness to the elbow with increased pain with pronation supination. Distal neurovascularly intact. No ecchymosis no edema noted. Neurologic:  Normal speech and language. No gross focal neurologic deficits are appreciated. No gait instability. Skin:  Skin is warm, dry and intact. No rash noted. Psychiatric: Mood and affect are normal. Speech and behavior are normal.  ____________________________________________   LABS (all labs ordered are listed, but only abnormal results are displayed)  Labs Reviewed - No data to display   RADIOLOGY  Negative for fracture dislocation. Interpreted by radiologist and reviewed by myself. ____________________________________________   PROCEDURES  Procedure(s) performed: None  Critical Care performed: No  ____________________________________________   INITIAL IMPRESSION / ASSESSMENT AND PLAN / ED COURSE  Pertinent labs & imaging results that were available during my care of the patient were reviewed by me and considered in my medical decision making (see chart for details).  Arthralgia right elbow. Rx given for ibuprofen 800 mg 3 times a day, baclofen 10 mg 3 times a day. Patient follow-up with PCP or return to the ER if worsening symptomology. Patient voices no other emergency medical complaints at this time. ____________________________________________   FINAL CLINICAL IMPRESSION(S) / ED DIAGNOSES  Final diagnoses:  Arthralgia of right upper arm      Evangeline Dakin, PA-C 03/03/15 1537  Governor Rooks, MD 03/04/15 [phone]

## 2015-03-03 NOTE — ED Notes (Signed)
Pt states her right arm began hurting at her elbow last week, doesn't know if she hit it up against anything, however, now she states she can't fully straighten out her arm, pain is throughout. Possible swelling to right hand.

## 2015-03-19 ENCOUNTER — Encounter: Payer: Self-pay | Admitting: Pain Medicine

## 2015-03-19 ENCOUNTER — Ambulatory Visit: Payer: 59 | Attending: Pain Medicine | Admitting: Pain Medicine

## 2015-03-19 VITALS — BP 127/60 | HR 85 | Temp 98.3°F | Resp 16 | Ht 62.0 in | Wt 160.0 lb

## 2015-03-19 DIAGNOSIS — M533 Sacrococcygeal disorders, not elsewhere classified: Secondary | ICD-10-CM | POA: Insufficient documentation

## 2015-03-19 DIAGNOSIS — M5416 Radiculopathy, lumbar region: Secondary | ICD-10-CM | POA: Insufficient documentation

## 2015-03-19 DIAGNOSIS — M5481 Occipital neuralgia: Secondary | ICD-10-CM | POA: Diagnosis not present

## 2015-03-19 DIAGNOSIS — M79604 Pain in right leg: Secondary | ICD-10-CM | POA: Diagnosis present

## 2015-03-19 DIAGNOSIS — M5136 Other intervertebral disc degeneration, lumbar region: Secondary | ICD-10-CM | POA: Diagnosis not present

## 2015-03-19 DIAGNOSIS — M79605 Pain in left leg: Secondary | ICD-10-CM | POA: Diagnosis present

## 2015-03-19 DIAGNOSIS — M170 Bilateral primary osteoarthritis of knee: Secondary | ICD-10-CM | POA: Insufficient documentation

## 2015-03-19 DIAGNOSIS — M47816 Spondylosis without myelopathy or radiculopathy, lumbar region: Secondary | ICD-10-CM

## 2015-03-19 DIAGNOSIS — M545 Low back pain: Secondary | ICD-10-CM | POA: Diagnosis present

## 2015-03-19 NOTE — Progress Notes (Signed)
Subjective:    Patient ID: Christy Mcbride, female    DOB: [birth year], 54 y.o.   MRN: [id number]  HPI  Patient is 54 year old female who [facility] evaluation and treatment of pain involving the lower back and lower extremity region. Patient states that she has lower back lower extremity pain which associated with frequent falling. Patient states that her leg appears to go out at times causing her to fall. We discussed patient's condition and will proceed with lumbar MRI update.. Attempts were made to locate patient's previous lumbar MRI. Since the previous lumbar MRI have been performed significant time in the past there was concern regarding new findings being on an updated lumbar MRI. Decision was made to proceed with updated lumbar MRI in attempt to further evaluate the etiology of patient's frequent falling associated with severe lower back lower extremity pain and lower extremity weakness. We also schedule patient for neurosurgical evaluation and will consider interventional treatment as well as additional modification of treatment regimen pending follow-up evaluation as discussed and explained to patient on today's visit. The patient was with understanding and agree with suggested treatment plan.      Review of Systems     Objective:   Physical Exam There was moderate tends to palpation paraspinal muscles and cervical region cervical facet region. There was tends to palpation of the cervical facet cervical paraspinal musculature and left as well as on the right. Palpation of the splenius capitis and occipitalis musculature regions reproduced mild to moderate discomfort. There appeared to be unremarkable Spurling's maneuver. There was tenderness of the acromial clavicular and glenohumeral joint region a minimal degree. Patient appeared to be with unremarkable Tinel and Phalen's maneuver which were without increased pain of significant degree. There was tends  to palpation of the thoracic region thoracic paraspinal muscles and the lower thoracic region of moderate to moderately severe degree. There was no crepitus of the thoracic region noted. Palpation of the lumbar paraspinal muscles region lumbar facet region was with moderate to moderately severe tenderness to palpation with moderate moderate severe muscle spasms noted. Lateral bending rotation and extension palpation of the lumbar facets reproduce moderately severe discomfort. Straight leg raising tolerated approximately 30 without increased pain with dorsiflexion noted. DTRs were difficult to elicit patient had difficulty relaxing. EHL strength appeared to be decreased. Knees with tenderness to palpation in crepitus of the knees noted. There was negative anterior and posterior drawer signs. No ballottement of the patella was noted. No definite sensory deficit of dermatomal distribution detected.. There was tennis to palpation of the PSIS and PII S region of mild to moderate degree. There was mild tends to palpation of the greater trochanteric region and iliotibial band region There was negative clonus negative Homans. Abdomen was nontender with no costovertebral angle tenderness noted.       Assessment & Plan:   Degenerative disc disease lumbar spine  Lumbar radiculopathy  Lumbar facet syndrome  Sacroiliac joint dysfunction  Degenerative joint disease of knees  Bilateral occipital neuralgia    PLAN   Continue present medication  F/U PCP Dr. Marvis MoellerMiles for evaliation of  BP and general medical  condition  F/U surgical evaluation as planned  F/U neurological evaluation. May consider PNCV/EMG studies and other studies pending follow-up evaluations  MRI of the lumbar spine scheduled to evaluate for degenerative disc disease lumbar spine, stenosis, and other abnormalities which may be contributing to patient's symptomatology  May consider radiofrequency rhizolysis or intraspinal procedures  pending response to present treatment and F/U evaluation   [facility] to call [facility] should patient have concerns prior to scheduled return appointment.

## 2015-03-19 NOTE — Patient Instructions (Addendum)
PLAN   Continue present medication  F/U PCP Dr. Marvis MoellerMiles for evaliation of  BP and general medical  condition  F/U surgical evaluation. Please ask staff the date of your neurosurgical evaluation of your lower back lower extremity pain  paresthesias and weakness  Please ask the staff the date of your lumbar MRI and neurosurgical evaluation  F/U neurological evaluation. May consider PNCV/EMG studies and other studies pending follow-up evaluations  May consider radiofrequency rhizolysis or intraspinal procedures pending response to present treatment and F/U evaluation   Patient to call [facility] should patient have concerns prior to scheduled return appointment.

## 2015-03-19 NOTE — Progress Notes (Signed)
Safety precautions to be maintained throughout the outpatient stay will include: orient to surroundings, keep bed in low position, maintain call bell within reach at all times, provide assistance with transfer out of bed and ambulation.  

## 2015-04-08 ENCOUNTER — Ambulatory Visit
Admission: RE | Admit: 2015-04-08 | Discharge: 2015-04-08 | Disposition: A | Discharge status: Home / Self Care | Payer: 59 | Source: Ambulatory Visit | Attending: Pain Medicine | Admitting: Pain Medicine

## 2015-04-08 DIAGNOSIS — M5126 Other intervertebral disc displacement, lumbar region: Secondary | ICD-10-CM | POA: Diagnosis not present

## 2015-04-08 DIAGNOSIS — M47816 Spondylosis without myelopathy or radiculopathy, lumbar region: Secondary | ICD-10-CM

## 2015-04-08 DIAGNOSIS — M4807 Spinal stenosis, lumbosacral region: Secondary | ICD-10-CM | POA: Insufficient documentation

## 2015-04-08 DIAGNOSIS — M5416 Radiculopathy, lumbar region: Secondary | ICD-10-CM

## 2015-04-08 DIAGNOSIS — M545 Low back pain: Secondary | ICD-10-CM | POA: Diagnosis present

## 2015-04-08 DIAGNOSIS — M5481 Occipital neuralgia: Secondary | ICD-10-CM

## 2015-04-08 DIAGNOSIS — M5136 Other intervertebral disc degeneration, lumbar region: Secondary | ICD-10-CM | POA: Diagnosis present

## 2015-04-08 DIAGNOSIS — G544 Lumbosacral root disorders, not elsewhere classified: Secondary | ICD-10-CM | POA: Insufficient documentation

## 2015-04-08 DIAGNOSIS — M79605 Pain in left leg: Secondary | ICD-10-CM | POA: Diagnosis present

## 2015-04-08 NOTE — Progress Notes (Signed)
Quick Note:  Please call patient and schedule surgical evaluation as well as appointment in pain clinic after we discuss results ______

## 2015-04-09 ENCOUNTER — Telehealth: Payer: Self-pay | Admitting: *Deleted

## 2015-04-09 NOTE — Telephone Encounter (Signed)
Spoke with patient and gave impression of MRI.  [facility] to discuss results as well as set up a surgical evaluation appt.

## 2015-04-29 ENCOUNTER — Ambulatory Visit: Payer: 59 | Attending: Pain Medicine | Admitting: Pain Medicine

## 2015-04-29 ENCOUNTER — Encounter: Payer: Self-pay | Admitting: Pain Medicine

## 2015-04-29 VITALS — BP 112/49 | HR 61 | Temp 99.0°F | Resp 16 | Ht 62.0 in | Wt 150.0 lb

## 2015-04-29 DIAGNOSIS — M5116 Intervertebral disc disorders with radiculopathy, lumbar region: Secondary | ICD-10-CM | POA: Insufficient documentation

## 2015-04-29 DIAGNOSIS — M545 Low back pain: Secondary | ICD-10-CM | POA: Diagnosis present

## 2015-04-29 DIAGNOSIS — M4806 Spinal stenosis, lumbar region: Secondary | ICD-10-CM | POA: Insufficient documentation

## 2015-04-29 DIAGNOSIS — M5481 Occipital neuralgia: Secondary | ICD-10-CM | POA: Insufficient documentation

## 2015-04-29 DIAGNOSIS — M5126 Other intervertebral disc displacement, lumbar region: Secondary | ICD-10-CM | POA: Diagnosis not present

## 2015-04-29 DIAGNOSIS — M533 Sacrococcygeal disorders, not elsewhere classified: Secondary | ICD-10-CM | POA: Insufficient documentation

## 2015-04-29 DIAGNOSIS — M79605 Pain in left leg: Secondary | ICD-10-CM | POA: Diagnosis present

## 2015-04-29 DIAGNOSIS — M5416 Radiculopathy, lumbar region: Secondary | ICD-10-CM

## 2015-04-29 DIAGNOSIS — M47816 Spondylosis without myelopathy or radiculopathy, lumbar region: Secondary | ICD-10-CM

## 2015-04-29 DIAGNOSIS — M170 Bilateral primary osteoarthritis of knee: Secondary | ICD-10-CM | POA: Diagnosis not present

## 2015-04-29 DIAGNOSIS — M47812 Spondylosis without myelopathy or radiculopathy, cervical region: Secondary | ICD-10-CM

## 2015-04-29 DIAGNOSIS — M5010 Cervical disc disorder with radiculopathy, unspecified cervical region: Secondary | ICD-10-CM

## 2015-04-29 DIAGNOSIS — M5136 Other intervertebral disc degeneration, lumbar region: Secondary | ICD-10-CM

## 2015-04-29 DIAGNOSIS — M79604 Pain in right leg: Secondary | ICD-10-CM | POA: Diagnosis present

## 2015-04-29 NOTE — Progress Notes (Signed)
Safety precautions to be maintained throughout the outpatient stay will include: orient to surroundings, keep bed in low position, maintain call bell within reach at all times, provide assistance with transfer out of bed and ambulation.  

## 2015-04-29 NOTE — Progress Notes (Signed)
Subjective:    Patient ID: Christy Mcbride, female    DOB: [birth year], 54 y.o.   MRN: [id number]  HPI the patient is a 54 year old female who [facility] to [facility] for further evaluation and treatment of pain involving the region of the lower back and lower extremity region predominantly with pain involving the neck and upper extremity regions as well. Patient states that her pain is aggravated by standing walking and is associated with weakness of the lower extremities. We reviewed patient's MRI of the lumbar spinal today's visit and recommend patient undergo lumbar epidural steroid injection. The patient is considering undergoing procedure. We also schedule patient for neurosurgical evaluation of lumbar lower extremity pain and pain of the cervical and upper extremity regions and schedule patient for cervical MRI on today's visit. We will await decision patient regarding proceeding with procedure and will also consider modifications of treatment pending surgical disposition. The patient was understanding and agreement with suggested treatment plan. Patient stated that her pain occurring on a daily basis inferior all activities of daily living. We will consider patient for interventional treatment and patient will proceed with neurosurgical evaluation for further recommendations regarding treatment of her condition as explained to patient on today's visit. The patient agreed to suggested treatment plan.      Review of Systems     Objective:   Physical Exam  There was tenderness to palpation over the cervical facet cervical paraspinal musculature region of moderate degree. Patient appeared to be unremarkable Spurling's maneuver. The patient appeared to be slightly decreased grip strength and Tinel and Phalen's maneuver were without increased pain of significant degree. There was tenderness to palpation over the thoracic facet and paraspinal musculature region. No crepitus of the thoracic  region was noted. Palpation over the lumbar paraspinal musculature region lumbar facet region was attends to palpation of moderate degree. Lateral bending rotation extension and palpation of the lumbar facets reproduce moderate discomfort. Straight leg raising was tolerates approximately 30 without a definite increased pain with dorsiflexion noted. EHL strength appeared to be decreased. No definite sensory deficit or dermatomal distribution detected. There was tenderness of the PSIS and PI is region a moderate degree. There was mild tenderness of the greater trochanteric region iliotibial band region. Abdomen nontender with no costovertebral angle tenderness noted.    Assessment & Plan:   Degenerative disc disease lumbar spine Dominant left side abnormality relates to a fall lateral/foraminal protrusion at L4-5 on the left, with severe compression of the left L4 nerve root is observed. Similar appearance on prior study. Minor multifactorial subarticular zone narrowing at the L5-S1 level on the left could be contributory  Lumbar radiculopathy  Lumbar stenosis with neurogenic claudication  Lumbar facet syndrome  Sacroiliac joint dysfunction  Degenerative joint disease of knees  Bilateral occipital neuralgia     PLAN  Continue present medication  F/U PCP Dr. Marvis MoellerMiles for evaliation of  BP and general medical  condition  Neurosurgical evaluation  Ask  receptionist the date of your neurosurgical evaluation of lower back and lower extremity pain and pain of the cervical and upper extremity regions  F/U neurological evaluation. May consider PNCV/EMG studies and other studies pending follow-up evaluations  MRI of the cervical  spine scheduled to evaluate for degenerative disc disease cervical  spine, stenosis, and other abnormalities which may be contributing to patient's symptomatology  May consider radiofrequency rhizolysis or intraspinal procedures pending response to present treatment and  F/U evaluation   Patient to call [facility] should patient have concerns prior to scheduled return appointment.

## 2015-04-29 NOTE — Patient Instructions (Addendum)
PLAN   Continue present medication  F/U PCP Dr. Marvis MoellerMiles for evaliation of  BP and general medical  condition  F/U surgical evaluation. Please ask staff the date of your neurosurgical evaluation of your lower back lower extremity pain  paresthesias and weakness  Ask receptionist the date of your cervical MRI and date of neurosurgical evaluation  F/U neurological evaluation. May consider PNCV/EMG studies and other studies pending follow-up evaluations  May consider radiofrequency rhizolysis or intraspinal procedures pending response to present treatment and F/U evaluation   Patient to call [facility] should patient have concerns prior to scheduled return appointment.

## 2015-05-01 ENCOUNTER — Telehealth: Payer: Self-pay

## 2015-05-01 NOTE — Telephone Encounter (Signed)
Please schedule neurosurgical eval and LESI. Pt has agreed to both of these.

## 2015-05-01 NOTE — Telephone Encounter (Signed)
Christy Mcbride and [name] Patient needs neurosurgical evaluation as discussed with patient prior to receiving medications. We have also discussed patient undergoing interventional treatment which I recommend palpation at this time as well Please schedule lumbar epidural steroid injection if patient wishes to proceed with procedure

## 2015-05-01 NOTE — Telephone Encounter (Signed)
Pt wants something for pain.

## 2015-05-01 NOTE — Telephone Encounter (Signed)
Having increased pain in the lower back, and pain shooting down left arm. Wants something for pain. Last appt was 04-29-15.

## 2015-05-01 NOTE — Telephone Encounter (Signed)
Next appt 05-28-15 for eval

## 2015-05-01 NOTE — Telephone Encounter (Signed)
Nurses and Oleg[facility] MessierKathy Please let me know when patient has next appointment and if patient is scheduled for a procedure Please discussed with me this morning and we can decide plan of treatment Of course patient will need evaluation prior to receiving any medications

## 2015-05-07 ENCOUNTER — Other Ambulatory Visit: Payer: Self-pay | Admitting: Pain Medicine

## 2015-05-22 ENCOUNTER — Encounter: Payer: Self-pay | Admitting: Pain Medicine

## 2015-05-22 ENCOUNTER — Ambulatory Visit: Payer: 59 | Attending: Pain Medicine | Admitting: Pain Medicine

## 2015-05-22 VITALS — BP 135/56 | HR 88 | Temp 98.0°F | Resp 16 | Ht 62.0 in | Wt 155.0 lb

## 2015-05-22 DIAGNOSIS — M545 Low back pain: Secondary | ICD-10-CM | POA: Diagnosis present

## 2015-05-22 DIAGNOSIS — M542 Cervicalgia: Secondary | ICD-10-CM | POA: Insufficient documentation

## 2015-05-22 DIAGNOSIS — M5010 Cervical disc disorder with radiculopathy, unspecified cervical region: Secondary | ICD-10-CM

## 2015-05-22 DIAGNOSIS — M79604 Pain in right leg: Secondary | ICD-10-CM | POA: Diagnosis present

## 2015-05-22 DIAGNOSIS — M5136 Other intervertebral disc degeneration, lumbar region: Secondary | ICD-10-CM | POA: Diagnosis not present

## 2015-05-22 DIAGNOSIS — M47816 Spondylosis without myelopathy or radiculopathy, lumbar region: Secondary | ICD-10-CM

## 2015-05-22 DIAGNOSIS — M79605 Pain in left leg: Secondary | ICD-10-CM | POA: Diagnosis present

## 2015-05-22 DIAGNOSIS — M47812 Spondylosis without myelopathy or radiculopathy, cervical region: Secondary | ICD-10-CM

## 2015-05-22 DIAGNOSIS — M5416 Radiculopathy, lumbar region: Secondary | ICD-10-CM

## 2015-05-22 DIAGNOSIS — M5481 Occipital neuralgia: Secondary | ICD-10-CM

## 2015-05-22 MED ORDER — SODIUM CHLORIDE 0.9 % IJ SOLN: 20.0000 mL | Freq: Once | INTRAMUSCULAR | Status: DC

## 2015-05-22 MED ORDER — MIDAZOLAM HCL 5 MG/5ML IJ SOLN
INTRAMUSCULAR | Status: AC
  Administered 2015-05-22: 3 mg via INTRAVENOUS
  Filled 2015-05-22: qty 5

## 2015-05-22 MED ORDER — LIDOCAINE HCL (PF) 1 % IJ SOLN
INTRAMUSCULAR | Status: AC
  Administered 2015-05-22: 12:00:00
  Filled 2015-05-22: qty 5

## 2015-05-22 MED ORDER — LACTATED RINGERS IV SOLN: 1000.0000 mL | INTRAVENOUS | Status: DC

## 2015-05-22 MED ORDER — FENTANYL CITRATE (PF) 100 MCG/2ML IJ SOLN: 100.0000 ug | Freq: Once | INTRAMUSCULAR | Status: DC

## 2015-05-22 MED ORDER — BUPIVACAINE HCL (PF) 0.25 % IJ SOLN: 30.0000 mL | Freq: Once | INTRAMUSCULAR | Status: DC

## 2015-05-22 MED ORDER — LIDOCAINE HCL (PF) 1 % IJ SOLN: 10.0000 mL | Freq: Once | INTRAMUSCULAR | Status: DC

## 2015-05-22 MED ORDER — ORPHENADRINE CITRATE 30 MG/ML IJ SOLN
INTRAMUSCULAR | Status: AC
  Administered 2015-05-22: 12:00:00
  Filled 2015-05-22: qty 2

## 2015-05-22 MED ORDER — MIDAZOLAM HCL 5 MG/5ML IJ SOLN: 5.0000 mg | Freq: Once | INTRAMUSCULAR | Status: DC

## 2015-05-22 MED ORDER — FENTANYL CITRATE (PF) 100 MCG/2ML IJ SOLN
INTRAMUSCULAR | Status: AC
  Administered 2015-05-22: 50 ug via INTRAVENOUS
  Filled 2015-05-22: qty 2

## 2015-05-22 MED ORDER — BUPIVACAINE HCL (PF) 0.25 % IJ SOLN
INTRAMUSCULAR | Status: AC
  Administered 2015-05-22: 12:00:00
  Filled 2015-05-22: qty 30

## 2015-05-22 MED ORDER — TRIAMCINOLONE ACETONIDE 40 MG/ML IJ SUSP
40.0000 mg | Freq: Once | INTRAMUSCULAR | Status: DC
Start: 2015-05-22 — End: 2015-07-28

## 2015-05-22 MED ORDER — SODIUM CHLORIDE 0.9 % IJ SOLN
INTRAMUSCULAR | Status: AC
  Administered 2015-05-22: 12:00:00
  Filled 2015-05-22: qty 20

## 2015-05-22 MED ORDER — TRIAMCINOLONE ACETONIDE 40 MG/ML IJ SUSP
INTRAMUSCULAR | Status: AC
  Administered 2015-05-22: 12:00:00
  Filled 2015-05-22: qty 1

## 2015-05-22 MED ORDER — ORPHENADRINE CITRATE 30 MG/ML IJ SOLN: 60.0000 mg | Freq: Once | INTRAMUSCULAR | Status: DC

## 2015-05-22 NOTE — Patient Instructions (Addendum)
PLAN   Continue present medication  F/U PCP Dr. Marvis MoellerMiles for evaliation of  BP and general medical  condition  F/U surgical evaluation. Neurosurgical evaluation of your lower back lower extremity pain  paresthesias and weakness  Ask receptionist the date of your cervical MRI and date of neurosurgical evaluation  F/U neurological evaluation. May consider PNCV/EMG studies and other studies pending follow-up evaluations  May consider radiofrequency rhizolysis or intraspinal procedures pending response to present treatment and F/U evaluation   Patient to call Pain Management Center should patient have concerns prior to scheduled return appointment.  Pain Management Discharge Instructions  General Discharge Instructions :  If you need to reach your doctor call: Monday-Friday 8:00 am - 4:00 pm at [phone] or toll free [phone].  After clinic hours [phone] to have operator reach doctor.  Bring all of your medication bottles to all your appointments in the pain clinic.  To cancel or reschedule your appointment with Pain Management please remember to call 24 hours in advance to avoid a fee.  Refer to the educational materials which you have been given on: General Risks, I had my Procedure. Discharge Instructions, Post Sedation.  Post Procedure Instructions:  The drugs you were given will stay in your system until tomorrow, so for the next 24 hours you should not drive, make any legal decisions or drink any alcoholic beverages.  You may eat anything you prefer, but it is better to start with liquids then soups and crackers, and gradually work up to solid foods.  Please notify your doctor immediately if you have any unusual bleeding, trouble breathing or pain that is not related to your normal pain.  Depending on the type of procedure that was done, some parts of your body may feel week and/or numb.  This usually clears up by tonight or the next day.  Walk with the use of an  assistive device or accompanied by an adult for the 24 hours.  You may use ice on the affected area for the first 24 hours.  Put ice in a Ziploc bag and cover with a towel and place against area 15 minutes on 15 minutes off.  You may switch to heat after 24 hours.

## 2015-05-22 NOTE — Progress Notes (Signed)
   Subjective:    Patient ID: Christy Mcbride, female    DOB: 05-08-1961, 54 y.o.   MRN: [id number]  HPI  PROCEDURE PERFORMED: Lumbar epidural steroid injection   NOTE: The patient is a 54 y.o. female who returns to [facility] Management Center for further evaluation and treatment of pain involving the lumbar and lower extremity region.  MRI revealed the patient to be with  Dominant left side abnormality relates to a fall lateral /foraminal protrusion at L4-L5 on the left with severe compression of the left L4 nerve root with similar findings on prior studies. Patient with multifactorial subarticular zone narrowing at the L5-S1 level on the left could be contributory. There is concern regarding intraspinal abnormalities contributing to patient's symptomatology. The patient is a pain involving the cervical region as well with significant muscle spasms of the cervical region. The risks, benefits, and expectations of the procedure have been discussed and explained to the patient who was understanding and in agreement with suggested treatment plan. We will proceed with lumbar epidural steroid injection as discussed and as explained to the patient who is willing to proceed with procedure as planned.   DESCRIPTION OF PROCEDURE: Lumbar epidural steroid injection with IV Versed, IV fentanyl conscious sedation, EKG, blood pressure, pulse, and pulse oximetry monitoring. The procedure was performed with the patient in the prone position under fluoroscopic guidance. A local anesthetic skin wheal of 1.5% plain lidocaine was accomplished at proposed entry site. An 18-gauge Tuohy epidural needle was inserted at the L  4 vertebral body level  left of the midline via loss-of-resistance technique with negative heme and negative CSF return. A total of 4 mL of Preservative-Free normal saline with 40 mg of Kenalog injected incrementally via epidurally placed needle. Needle was removed.  Myoneural block injections of the cervical  musculature region  following Betadine prep of proposed entry site a 22-gauge needle was inserted in the cervical musculature region and following negative aspiration a total of 2 cc of 0.25% bupivacaine with Norflex was injected for myoneural block injection of the cervical musculature region 4   the patient tolerated procedure well   A total of 40 mg of Kenalog was utilized for the procedure.   The patient tolerated the injection well.    PLAN:   1. Medications: We will continue presently prescribed medications. 2. Will consider modification of treatment regimen pending response to treatment rendered on today's visit and follow-up evaluation. 3. The patient is to follow-up with primary care physician  Dr. Marvis MoellerMiles regarding blood pressure and general medical condition status post lumbar epidural steroid injection performed on today's visit. 4. Surgical evaluation. Surgical evaluation as planned 5. Neurological evaluation. PNCV EMG studies have been addressed 6. The patient may be a candidate for radiofrequency procedures, implantation device, and other treatment pending response to treatment and follow-up evaluation. 7. The patient has been advised to adhere to proper body mechanics and avoid activities which appear to aggravate condition. 8. The patient has been advised to call the Pain Management Center prior to scheduled return appointment should there be significant change in condition or should there be sign  The patient is understanding and agrees with the suggested  treatment plan   Review of Systems     Objective:   Physical Exam        Assessment & Plan:

## 2015-05-22 NOTE — Progress Notes (Signed)
Safety precautions to be maintained throughout the outpatient stay will include: orient to surroundings, keep bed in low position, maintain call bell within reach at all times, provide assistance with transfer out of bed and ambulation.  

## 2015-05-23 ENCOUNTER — Telehealth: Payer: Self-pay

## 2015-05-23 ENCOUNTER — Ambulatory Visit: Payer: 59

## 2015-05-23 NOTE — Telephone Encounter (Signed)
Post procedure call. Patient states she is having some cramping in her stomach.  Dr Metta Clinesrisp notified.  Instructed to use heat today and to call for for any further questions or concerns.

## 2015-05-28 ENCOUNTER — Ambulatory Visit: Payer: 59 | Attending: Pain Medicine | Admitting: Pain Medicine

## 2015-05-28 ENCOUNTER — Encounter: Payer: Self-pay | Admitting: Pain Medicine

## 2015-05-28 VITALS — BP 120/60 | HR 82 | Temp 98.1°F | Resp 16 | Ht 62.0 in | Wt 155.0 lb

## 2015-05-28 DIAGNOSIS — M5010 Cervical disc disorder with radiculopathy, unspecified cervical region: Secondary | ICD-10-CM

## 2015-05-28 DIAGNOSIS — M5136 Other intervertebral disc degeneration, lumbar region: Secondary | ICD-10-CM

## 2015-05-28 DIAGNOSIS — M545 Low back pain: Secondary | ICD-10-CM | POA: Diagnosis present

## 2015-05-28 DIAGNOSIS — M5416 Radiculopathy, lumbar region: Secondary | ICD-10-CM | POA: Diagnosis not present

## 2015-05-28 DIAGNOSIS — M4806 Spinal stenosis, lumbar region: Secondary | ICD-10-CM | POA: Diagnosis not present

## 2015-05-28 DIAGNOSIS — M170 Bilateral primary osteoarthritis of knee: Secondary | ICD-10-CM | POA: Diagnosis not present

## 2015-05-28 DIAGNOSIS — M533 Sacrococcygeal disorders, not elsewhere classified: Secondary | ICD-10-CM | POA: Insufficient documentation

## 2015-05-28 DIAGNOSIS — M5481 Occipital neuralgia: Secondary | ICD-10-CM

## 2015-05-28 DIAGNOSIS — M47816 Spondylosis without myelopathy or radiculopathy, lumbar region: Secondary | ICD-10-CM

## 2015-05-28 DIAGNOSIS — M47812 Spondylosis without myelopathy or radiculopathy, cervical region: Secondary | ICD-10-CM

## 2015-05-28 NOTE — Progress Notes (Signed)
Subjective:    Patient ID: Christy Mcbride, female    DOB: Oct 20, 1960, 54 y.o.   MRN: [id number]  HPI  The patient is a 54 year old female who returns to pain management Center for further evaluation and treatment of pain involving the mid lower back and lower extremity region. Patient states the pain is aggravated by standing walking twisting turning maneuvers. Patient continues to work in states the pain increases as the day progresses as patient spends more time on her feet. We discussed patient undergoing surgical evaluation and we'll attempt to reschedule patient for neurosurgical evaluation of her lumbar lower extremity pain as discussed and as explained to patient on today's visit. The patient was understanding and agreement suggested treatment plan. We'll also discuss interventional treatment and will schedule patient for lumbosacral selective nerve root block to be performed at time return appointment in attempt to decrease severity of symptoms, minimize progression of symptoms, and avoid the need for more involved treatment. Patient was with understanding and agreement suggested treatment plan. We also informed patient that patient should continue Neurontin and avoid excessive drowsiness confusion and other undesirable side effects. We also informed patient that we will consider prescribing additional medications once we obtained permission for prescribing patient's medications for treatment of patient's pain. The patient was understanding and agreement suggested treatment plan.   Review of Systems     Objective:   Physical Exam  There was tenderness to palpation of the paraspinal musculature region of the cervical region cervical facet region palpation which reproduces pain of mild-to-moderate degree. There was mild to moderate tenderness to palpation of the acromioclavicular and glenohumeral joint region. Patient appeared to be unremarkable Spurling's maneuver. Palpation of the splenius  capitis and occipitalis musculature regions reproduce moderate discomfort. Palpation over the thoracic facet thoracic paraspinal musculature and was associated with muscle spasms of the lower thoracic paraspinal musculature region left greater than right. No crepitus of the thoracic region was noted. Palpation over the lumbar paraspinal must reason lumbar facet region was attends to palpation with lateral bending rotation extension and palpation of the lumbar facets reproducing moderate to moderately severe discomfort. There was moderate to moderately severe tenderness to palpation of the PSIS and PII S region and gluteal and piriformis musculature regions left greater than right. Straight leg raising limited to approximately 20 with questionable increased pain with dorsiflexion noted. There was negative clonus negative Homans. DTRs appeared to be trace at the knees with negative clonus negative Homans. Abdomen was nontender with no costovertebral tenderness noted.Degenerative disc disease lumbar spine Dominant left side abnormality relates to a fall lateral/foraminal protrusion at L4-5 on the left, with severe compression of the left L4 nerve root is observed. Similar appearance on prior study. Minor multifactorial subarticular zone narrowing at the L5-S1 level on the left could be contributory        ASSESSMENT   Lumbar radiculopathy  Lumbar stenosis with neurogenic claudication  Lumbar facet syndrome  Sacroiliac joint dysfunction  Degenerative joint disease of knees     PLAN   Continue present medication Neurontin (gabapentin)  Lumbosacral selective nerve root block to be performed at time of return appointment  F/U PCP Dr. Marvis MoellerMiles for evaliation of  BP and general medical  condition  F/U surgical evaluation. Please ask staff the date of your neurosurgical evaluation of your lower back lower extremity pain  paresthesias and weakness  F/U neurological evaluation. May consider  PNCV/EMG studies and other studies pending follow-up evaluations  May consider radiofrequency rhizolysis or  intraspinal procedures pending response to present treatment and F/U evaluation   Patient to call Pain Management Center should patient have concerns prior to scheduled return appointment    Assessment & Plan:

## 2015-05-28 NOTE — Patient Instructions (Addendum)
PLAN   Continue present medication Neurontin (gabapentin)  Lumbosacral selective nerve root block to be performed at time of return appointment  F/U PCP Dr. Marvis MoellerMiles for evaliation of  BP and general medical  condition  F/U surgical evaluation. Please ask staff the date of your neurosurgical evaluation of your lower back lower extremity pain  paresthesias and weakness  F/U neurological evaluation. May consider PNCV/EMG studies and other studies pending follow-up evaluations  May consider radiofrequency rhizolysis or intraspinal procedures pending response to present treatment and F/U evaluation   Patient to call [facility] should patient have concerns prior to scheduled return appointment.GENERAL RISKS AND COMPLICATIONS  What are the risk, side effects and possible complications? Generally speaking, most procedures are safe.  However, with any procedure there are risks, side effects, and the possibility of complications.  The risks and complications are dependent upon the sites that are lesioned, or the type of nerve block to be performed.  The closer the procedure is to the spine, the more serious the risks are.  Great care is taken when placing the radio frequency needles, block needles or lesioning probes, but sometimes complications can occur. 1. Infection: Any time there is an injection through the skin, there is a risk of infection.  This is why sterile conditions are used for these blocks.  There are four possible types of infection. 1. Localized skin infection. 2. Central Nervous System Infection-This can be in the form of Meningitis, which can be deadly. 3. Epidural Infections-This can be in the form of an epidural abscess, which can cause pressure inside of the spine, causing compression of the spinal cord with subsequent paralysis. This would require an emergency surgery to decompress, and there are no guarantees that the patient would recover from the  paralysis. 4. Discitis-This is an infection of the intervertebral discs.  It occurs in about 1% of discography procedures.  It is difficult to treat and it may lead to surgery.        2. Pain: the needles have to go through skin and soft tissues, will cause soreness.       3. Damage to internal structures:  The nerves to be lesioned may be near blood vessels or    other nerves which can be potentially damaged.       4. Bleeding: Bleeding is more common if the patient is taking blood thinners such as  aspirin, Coumadin, Ticiid, Plavix, etc., or if he/she have some genetic predisposition  such as hemophilia. Bleeding into the spinal canal can cause compression of the spinal  cord with subsequent paralysis.  This would require an emergency surgery to  decompress and there are no guarantees that the patient would recover from the  paralysis.       5. Pneumothorax:  Puncturing of a lung is a possibility, every time a needle is introduced in  the area of the chest or upper back.  Pneumothorax refers to free air around the  collapsed lung(s), inside of the thoracic cavity (chest cavity).  Another two possible  complications related to a similar event would include: Hemothorax and Chylothorax.   These are variations of the Pneumothorax, where instead of air around the collapsed  lung(s), you may have blood or chyle, respectively.       6. Spinal headaches: They may occur with any procedures in the area of the spine.       7. Persistent CSF (Cerebro-Spinal Fluid) leakage: This is a rare problem, but may  occur  with prolonged intrathecal or epidural catheters either due to the formation of a fistulous  track or a dural tear.       8. Nerve damage: By working so close to the spinal cord, there is always a possibility of  nerve damage, which could be as serious as a permanent spinal cord injury with  paralysis.       9. Death:  Although rare, severe deadly allergic reactions known as "Anaphylactic  reaction" can  occur to any of the medications used.      10. Worsening of the symptoms:  We can always make thing worse.  What are the chances of something like this happening? Chances of any of this occuring are extremely low.  By statistics, you have more of a chance of getting killed in a motor vehicle accident: while driving to the hospital than any of the above occurring .  Nevertheless, you should be aware that they are possibilities.  In general, it is similar to taking a shower.  Everybody knows that you can slip, hit your head and get killed.  Does that mean that you should not shower again?  Nevertheless always keep in mind that statistics do not mean anything if you happen to be on the wrong side of them.  Even if a procedure has a 1 (one) in a 1,000,000 (million) chance of going wrong, it you happen to be that one..Also, keep in mind that by statistics, you have more of a chance of having something go wrong when taking medications.  Who should not have this procedure? If you are on a blood thinning medication (e.g. Coumadin, Plavix, see list of "Blood Thinners"), or if you have an active infection going on, you should not have the procedure.  If you are taking any blood thinners, please inform your physician.  How should I prepare for this procedure?  Do not eat or drink anything at least six hours prior to the procedure.  Bring a driver with you .  It cannot be a taxi.  Come accompanied by an adult that can drive you back, and that is strong enough to help you if your legs get weak or numb from the local anesthetic.  Take all of your medicines the morning of the procedure with just enough water to swallow them.  If you have diabetes, make sure that you are scheduled to have your procedure done first thing in the morning, whenever possible.  If you have diabetes, take only half of your insulin dose and notify our nurse that you have done so as soon as you arrive at the clinic.  If you are  diabetic, but only take blood sugar pills (oral hypoglycemic), then do not take them on the morning of your procedure.  You may take them after you have had the procedure.  Do not take aspirin or any aspirin-containing medications, at least eleven (11) days prior to the procedure.  They may prolong bleeding.  Wear loose fitting clothing that may be easy to take off and that you would not mind if it got stained with Betadine or blood.  Do not wear any jewelry or perfume  Remove any nail coloring.  It will interfere with some of our monitoring equipment.  NOTE: Remember that this is not meant to be interpreted as a complete list of all possible complications.  Unforeseen problems may occur.  BLOOD THINNERS The following drugs contain aspirin or other products, which can cause increased bleeding  during surgery and should not be taken for 2 weeks prior to and 1 week after surgery.  If you should need take something for relief of minor pain, you may take acetaminophen which is found in Tylenol,m Datril, Anacin-3 and Panadol. It is not blood thinner. The products listed below are.  Do not take any of the products listed below in addition to any listed on your instruction sheet.  A.P.C or A.P.C with Codeine Codeine Phosphate Capsules #3 Ibuprofen Ridaura  ABC compound Congesprin Imuran rimadil  Advil Cope Indocin Robaxisal  Alka-Seltzer Effervescent Pain Reliever and Antacid Coricidin or Coricidin-D  Indomethacin Rufen  Alka-Seltzer plus Cold Medicine Cosprin Ketoprofen S-A-C Tablets  Anacin Analgesic Tablets or Capsules Coumadin Korlgesic Salflex  Anacin Extra Strength Analgesic tablets or capsules CP-2 Tablets Lanoril Salicylate  Anaprox Cuprimine Capsules Levenox Salocol  Anexsia-D Dalteparin Magan Salsalate  Anodynos Darvon compound Magnesium Salicylate Sine-off  Ansaid Dasin Capsules Magsal Sodium Salicylate  Anturane Depen Capsules Marnal Soma  APF Arthritis pain formula Dewitt's Pills  Measurin Stanback  Argesic Dia-Gesic Meclofenamic Sulfinpyrazone  Arthritis Bayer Timed Release Aspirin Diclofenac Meclomen Sulindac  Arthritis pain formula Anacin Dicumarol Medipren Supac  Analgesic (Safety coated) Arthralgen Diffunasal Mefanamic Suprofen  Arthritis Strength Bufferin Dihydrocodeine Mepro Compound Suprol  Arthropan liquid Dopirydamole Methcarbomol with Aspirin Synalgos  ASA tablets/Enseals Disalcid Micrainin Tagament  Ascriptin Doan's Midol Talwin  Ascriptin A/D Dolene Mobidin Tanderil  Ascriptin Extra Strength Dolobid Moblgesic Ticlid  Ascriptin with Codeine Doloprin or Doloprin with Codeine Momentum Tolectin  Asperbuf Duoprin Mono-gesic Trendar  Aspergum Duradyne Motrin or Motrin IB Triminicin  Aspirin plain, buffered or enteric coated Durasal Myochrisine Trigesic  Aspirin Suppositories Easprin Nalfon Trillsate  Aspirin with Codeine Ecotrin Regular or Extra Strength Naprosyn Uracel  Atromid-S Efficin Naproxen Ursinus  Auranofin Capsules Elmiron Neocylate Vanquish  Axotal Emagrin Norgesic Verin  Azathioprine Empirin or Empirin with Codeine Normiflo Vitamin E  Azolid Emprazil Nuprin Voltaren  Bayer Aspirin plain, buffered or children's or timed BC Tablets or powders Encaprin Orgaran Warfarin Sodium  Buff-a-Comp Enoxaparin Orudis Zorpin  Buff-a-Comp with Codeine Equegesic Os-Cal-Gesic   Buffaprin Excedrin plain, buffered or Extra Strength Oxalid   Bufferin Arthritis Strength Feldene Oxphenbutazone   Bufferin plain or Extra Strength Feldene Capsules Oxycodone with Aspirin   Bufferin with Codeine Fenoprofen Fenoprofen Pabalate or Pabalate-SF   Buffets II Flogesic Panagesic   Buffinol plain or Extra Strength Florinal or Florinal with Codeine Panwarfarin   Buf-Tabs Flurbiprofen Penicillamine   Butalbital Compound Four-way cold tablets Penicillin   Butazolidin Fragmin Pepto-Bismol   Carbenicillin Geminisyn Percodan   Carna Arthritis Reliever Geopen Persantine    Carprofen Gold's salt Persistin   Chloramphenicol Goody's Phenylbutazone   Chloromycetin Haltrain Piroxlcam   Clmetidine heparin Plaquenil   Cllnoril Hyco-pap Ponstel   Clofibrate Hydroxy chloroquine Propoxyphen         Before stopping any of these medications, be sure to consult the physician who ordered them.  Some, such as Coumadin (Warfarin) are ordered to prevent or treat serious conditions such as "deep thrombosis", "pumonary embolisms", and other heart problems.  The amount of time that you may need off of the medication may also vary with the medication and the reason for which you were taking it.  If you are taking any of these medications, please make sure you notify your pain physician before you undergo any procedures.         Selective Nerve Root Block Patient Information  Description: Specific nerve roots exit the spinal canal and  these nerves can be compressed and inflamed by a bulging disc and bone spurs.  By injecting steroids on the nerve root, we can potentially decrease the inflammation surrounding these nerves, which often leads to decreased pain.  Also, by injecting local anesthesia on the nerve root, this can provide [place] helpful information to give to your referring doctor if it decreases your pain.  Selective nerve root blocks can be done along the spine from the neck to the low back depending on the location of your pain.   After numbing the skin with local anesthesia, a small needle is passed to the nerve root and the position of the needle is verified using x-ray pictures.  After the needle is in correct position, we then deposit the medication.  You may experience a pressure sensation while this is being done.  The entire block usually lasts less than 15 minutes.  Conditions that may be treated with selective nerve root blocks:  Low back and leg pain  Spinal stenosis  Diagnostic block prior to potential surgery  Neck and arm pain  Post laminectomy  syndrome  Preparation for the injection:  1. Do not eat any solid food or dairy products within 6 hours of your appointment. 2. You may drink clear liquids up to 2 hours before an appointment.  Clear liquids include water, black coffee, juice or soda.  No milk or cream please. 3. You may take your regular medications, including pain medications, with a sip of water before your appointment.  Diabetics should hold regular insulin (if taken separately) and take 1/2 normal NPH dose the morning of the procedure.  Carry some sugar containing items with you to your appointment. 4. A driver must accompany you and be prepared to drive you home after your procedure. 5. Bring all your current medications with you. 6. An IV may be inserted and sedation may be given at the discretion of the physician. 7. A blood pressure cuff, EKG, and other monitors will often be applied during the procedure.  Some patients may need to have extra oxygen administered for a short period. 8. You will be asked to provide medical information, including allergies, prior to the procedure.  We must know immediately if you are taking blood  Thinners (like Coumadin) or if you are allergic to IV iodine contrast (dye).  Possible side-effects: All are usually temporary  Bleeding from needle site  Light headedness  Numbness and tingling  Decreased blood pressure  Weakness in arms/legs  Pressure sensation in back/neck  Pain at injection site (several days)  Possible complications: All are extremely rare  Infection  Nerve injury  Spinal headache (a headache wore with upright position)  Call if you experience:  Fever/chills associated with headache or increased back/neck pain  Headache worsened by an upright position  New onset weakness or numbness of an extremity below the injection site  Hives or difficulty breathing (go to the emergency room)  Inflammation or drainage at the injection site(s)  Severe  back/neck pain greater than usual  New symptoms which are concerning to you  Please note:  Although the local anesthetic injected can often make your back or neck feel good for several hours after the injection the pain will likely return.  It takes 3-5 days for steroids to work on the nerve root. You may not notice any pain relief for at least one week.  If effective, we will often do a series of 3 injections spaced 3-6 weeks apart to maximally  decrease your pain.    If you have any questions, please call [phone] [facility]

## 2015-05-28 NOTE — Progress Notes (Signed)
Safety precautions to be maintained throughout the outpatient stay will include: orient to surroundings, keep bed in low position, maintain call bell within reach at all times, provide assistance with transfer out of bed and ambulation.  

## 2015-05-31 ENCOUNTER — Ambulatory Visit
Admission: RE | Admit: 2015-05-31 | Discharge: 2015-05-31 | Disposition: A | Discharge status: Home / Self Care | Payer: 59 | Source: Ambulatory Visit | Attending: Pain Medicine | Admitting: Pain Medicine

## 2015-05-31 DIAGNOSIS — M5021 Other cervical disc displacement,  high cervical region: Secondary | ICD-10-CM | POA: Diagnosis not present

## 2015-05-31 DIAGNOSIS — M4802 Spinal stenosis, cervical region: Secondary | ICD-10-CM | POA: Diagnosis not present

## 2015-05-31 DIAGNOSIS — M5481 Occipital neuralgia: Secondary | ICD-10-CM

## 2015-05-31 DIAGNOSIS — M47816 Spondylosis without myelopathy or radiculopathy, lumbar region: Secondary | ICD-10-CM

## 2015-05-31 DIAGNOSIS — M542 Cervicalgia: Secondary | ICD-10-CM | POA: Insufficient documentation

## 2015-05-31 DIAGNOSIS — M5416 Radiculopathy, lumbar region: Secondary | ICD-10-CM

## 2015-05-31 DIAGNOSIS — M5010 Cervical disc disorder with radiculopathy, unspecified cervical region: Secondary | ICD-10-CM

## 2015-05-31 DIAGNOSIS — M47812 Spondylosis without myelopathy or radiculopathy, cervical region: Secondary | ICD-10-CM

## 2015-05-31 DIAGNOSIS — M79603 Pain in arm, unspecified: Secondary | ICD-10-CM | POA: Insufficient documentation

## 2015-05-31 DIAGNOSIS — M47892 Other spondylosis, cervical region: Secondary | ICD-10-CM | POA: Insufficient documentation

## 2015-05-31 DIAGNOSIS — M5136 Other intervertebral disc degeneration, lumbar region: Secondary | ICD-10-CM

## 2015-06-04 ENCOUNTER — Other Ambulatory Visit: Payer: Self-pay | Admitting: Pain Medicine

## 2015-06-04 ENCOUNTER — Telehealth: Payer: Self-pay | Admitting: *Deleted

## 2015-06-04 NOTE — Telephone Encounter (Signed)
Spoke with patient re; MRI results of cervical spine and Dr Letta Moynahanrisp's recommendation of having neurosurgical evaluation before proceeding with any other treatment.  Patient states that she has an appointment on Monday with Neurosurgeon.

## 2015-06-13 ENCOUNTER — Telehealth: Payer: Self-pay

## 2015-06-13 NOTE — Telephone Encounter (Signed)
Pt wants to know what is going on with [place]? She says that someone was suppose to fax Dr Marvis MoellerMiles concerning this situation

## 2015-06-13 NOTE — Telephone Encounter (Signed)
Pt wants to know what is going on with her tramadol scripts she says someone was suppose to fax paper work to Dr. Marvis MoellerMiles

## 2015-06-15 ENCOUNTER — Emergency Department: Payer: BLUE CROSS/BLUE SHIELD

## 2015-06-15 ENCOUNTER — Emergency Department
Admission: EM | Admit: 2015-06-15 | Discharge: 2015-06-15 | Disposition: A | Discharge status: Home / Self Care | Payer: BLUE CROSS/BLUE SHIELD | Attending: Emergency Medicine | Admitting: Emergency Medicine

## 2015-06-15 DIAGNOSIS — Z880 Allergy status to penicillin: Secondary | ICD-10-CM | POA: Insufficient documentation

## 2015-06-15 DIAGNOSIS — I10 Essential (primary) hypertension: Secondary | ICD-10-CM | POA: Diagnosis not present

## 2015-06-15 DIAGNOSIS — E119 Type 2 diabetes mellitus without complications: Secondary | ICD-10-CM | POA: Insufficient documentation

## 2015-06-15 DIAGNOSIS — M79604 Pain in right leg: Secondary | ICD-10-CM | POA: Diagnosis not present

## 2015-06-15 DIAGNOSIS — Z87891 Personal history of nicotine dependence: Secondary | ICD-10-CM | POA: Diagnosis not present

## 2015-06-15 LAB — CK: CK TOTAL: 115 U/L (ref 38–234)

## 2015-06-15 LAB — BASIC METABOLIC PANEL
ANION GAP: 6 (ref 5–15)
BUN: 18 mg/dL (ref 6–20)
CHLORIDE: 108 mmol/L (ref 101–111)
CO2: 28 mmol/L (ref 22–32)
Calcium: 8.7 mg/dL — ABNORMAL LOW (ref 8.9–10.3)
Creatinine, Ser: 0.52 mg/dL (ref 0.44–1.00)
GFR calc non Af Amer: >60 mL/min (ref >60)
Glucose, Bld: 137 mg/dL — ABNORMAL HIGH (ref 65–99)
POTASSIUM: 3.7 mmol/L (ref 3.5–5.1)
Sodium: 142 mmol/L (ref 135–145)

## 2015-06-15 LAB — CBC WITH DIFFERENTIAL/PLATELET
BASOS ABS: 0 10*3/uL (ref 0–0.1)
BASOS PCT: 1 %
Eosinophils Absolute: 0.1 10*3/uL (ref 0–0.7)
Eosinophils Relative: 3 %
HEMATOCRIT: 37.1 % (ref 35.0–47.0)
HEMOGLOBIN: 12 g/dL (ref 12.0–16.0)
Lymphocytes Relative: 26 %
Lymphs Abs: 1.4 10*3/uL (ref 1.0–3.6)
MCH: 26 pg (ref 26.0–34.0)
MCHC: 32.5 g/dL (ref 32.0–36.0)
MCV: 79.9 fL — ABNORMAL LOW (ref 80.0–100.0)
MONOS PCT: 9 %
Monocytes Absolute: 0.5 10*3/uL (ref 0.2–0.9)
NEUTROS ABS: 3.2 10*3/uL (ref 1.4–6.5)
NEUTROS PCT: 61 %
Platelets: 235 10*3/uL (ref 150–440)
RBC: 4.64 MIL/uL (ref 3.80–5.20)
RDW: 15.3 % — ABNORMAL HIGH (ref 11.5–14.5)
WBC: 5.3 10*3/uL (ref 3.6–11.0)

## 2015-06-15 MED ORDER — OXYCODONE-ACETAMINOPHEN 5-325 MG PO TABS
1.0000 | ORAL_TABLET | Freq: Once | ORAL | Status: AC
  Administered 2015-06-15: 1 via ORAL
  Filled 2015-06-15: qty 1

## 2015-06-15 MED ORDER — IBUPROFEN 600 MG PO TABS
600.0000 mg | ORAL_TABLET | Freq: Once | ORAL | Status: AC
  Administered 2015-06-15: 600 mg via ORAL
  Filled 2015-06-15: qty 1

## 2015-06-15 MED ORDER — TRAMADOL HCL 50 MG PO TABS: 50.0000 mg | ORAL_TABLET | Freq: Four times a day (QID) | ORAL | Status: DC | PRN

## 2015-06-15 NOTE — ED Notes (Signed)
Right lower leg pain x 3 days. Denies injury. Pt reports she stands long periods of time at her job.

## 2015-06-15 NOTE — ED Notes (Signed)
Pt complains of right calf pain for a day. Pt states pain now extends to lateral right calf from central calf. 2+ pedal pulses bilateral noted, no redness or swelling noted to either calf. Pt denies shob, denies hemoptysis or fever. Pt with pwd skin, no resp distress noted. Pt states pain increases with movement.

## 2015-06-15 NOTE — Discharge Instructions (Signed)
Musculoskeletal Pain Musculoskeletal pain is muscle and boney aches and pains. These pains can occur in any part of the body. Your caregiver may treat you without knowing the cause of the pain. They may treat you if blood or urine tests, X-rays, and other tests were normal. Be aware we cannot rule out DVT comprehensive leg at this time if it is low in the calf therefore you would have to follow closely with your doctor for repeat ultrasound in 1 week if symptoms persist. If you have chest pain shortness of breath, nausea, vomiting, numbness, weakness, change in her bowel or bladder including incontinence or you feel worse in any way return to the emergency department CAUSES There is often not a definite cause or reason for these pains. These pains may be caused by a type of germ (virus). The discomfort may also come from overuse. Overuse includes working out too hard when your body is not fit. Boney aches also come from weather changes. Bone is sensitive to atmospheric pressure changes. HOME CARE INSTRUCTIONS   Ask when your test results will be ready. Make sure you get your test results.  Only take over-the-counter or prescription medicines for pain, discomfort, or fever as directed by your caregiver. If you were given medications for your condition, do not drive, operate machinery or power tools, or sign legal documents for 24 hours. Do not drink alcohol. Do not take sleeping pills or other medications that may interfere with treatment.  Continue all activities unless the activities cause more pain. When the pain lessens, slowly resume normal activities. Gradually increase the intensity and duration of the activities or exercise.  During periods of severe pain, bed rest may be helpful. Lay or sit in any position that is comfortable.  Putting ice on the injured area.  Put ice in a bag.  Place a towel between your skin and the bag.  Leave the ice on for 15 to 20 minutes, 3 to 4 times a  day.  Follow up with your caregiver for continued problems and no reason can be found for the pain. If the pain becomes worse or does not go away, it may be necessary to repeat tests or do additional testing. Your caregiver may need to look further for a possible cause. SEEK IMMEDIATE MEDICAL CARE IF:  You have pain that is getting worse and is not relieved by medications.  You develop chest pain that is associated with shortness or breath, sweating, feeling sick to your stomach (nauseous), or throw up (vomit).  Your pain becomes localized to the abdomen.  You develop any new symptoms that seem different or that concern you. MAKE SURE YOU:   Understand these instructions.  Will watch your condition.  Will get help right away if you are not doing well or get worse.   This information is not intended to replace advice given to you by your health care provider. Make sure you discuss any questions you have with your health care provider.   Document Released: 05/18/2005 Document Revised: 08/10/2011 Document Reviewed: 01/20/2013 [employer] Interactive Patient Education [employer]2016 [employer]

## 2015-06-15 NOTE — ED Provider Notes (Signed)
[facility] Emergency Department Provider Note  ____________________________________________   I have reviewed the triage vital signs and the nursing notes.   HISTORY  Chief Complaint Leg Pain    HPI Christy Mcbride is a 55 y.o. female with no history of blood clots. She states that she has had calf pain for the last few days when she works. She works standing up. She has no recollected injury but she may have slipped on the ice she states. She denies any chest pain or shortness of breath. She denies any swelling. She has constant chronic lower back pain which radiates usually to the left leg, sometimes however this pain goes down the right. She states the pain in her right calf is worse when she lifts her leg up. She denies any numbness or weakness. She denies any recent fall. She denies incontinence of bowel or bladder.  Past Medical History  Diagnosis Date  . Diabetes mellitus without complication (HCC)   . Thyroid disease   . Liver disease   . Herniated cervical disc   . Hypertension   . Allergy     Patient Active Problem List   Diagnosis Date Noted  . DDD (degenerative disc disease), lumbar 03/19/2015  . Lumbar radiculopathy 03/19/2015  . Facet syndrome, lumbar 03/19/2015  . Bilateral occipital neuralgia 03/19/2015    Past Surgical History  Procedure Laterality Date  . Ablation    . Cholecystectomy    . Cesarean section    . Liver biopsy      Current Outpatient Rx  Name  Route  Sig  Dispense  Refill  . baclofen (LIORESAL) 10 MG tablet   Oral   Take 1 tablet (10 mg total) by mouth 3 (three) times daily. Patient not taking: Reported on 04/29/2015   30 tablet   0   . diazepam (VALIUM) 5 MG tablet   Oral   Take 5 mg by mouth 2 (two) times daily as needed. For anxiety and sleep         . ERGOCALCIFEROL PO   Oral   Take 1.25 mg by mouth once a week.         . Estradiol-Norethindrone Acet 0.5-0.1 MG per tablet   Oral   Take 1 tablet  by mouth daily. Reported on 05/28/2015         . furosemide (LASIX) 20 MG tablet   Oral   Take 20 mg by mouth as needed.          . gabapentin (NEURONTIN) 300 MG capsule   Oral   Take 300 mg by mouth daily.         Marland Kitchen ibuprofen (ADVIL,MOTRIN) 800 MG tablet   Oral   Take 1 tablet (800 mg total) by mouth every 8 (eight) hours as needed.   30 tablet   0   . metFORMIN (GLUMETZA) 500 MG (MOD) 24 hr tablet   Oral   Take 500 mg by mouth daily with breakfast.         . metoCLOPramide (REGLAN) 10 MG tablet   Oral   Take 1 tablet (10 mg total) by mouth 4 (four) times daily -  before meals and at bedtime.   60 tablet   0   . omeprazole (PRILOSEC) 20 MG capsule   Oral   Take 20 mg by mouth 2 (two) times daily before a meal.         . PARoxetine (PAXIL) 40 MG tablet   Oral  Take 40 mg by mouth daily.           Allergies Sulfa antibiotics; Fentanyl; Penicillins; and Topiramate  Family History  Problem Relation Age of Onset  . Hypertension Mother   . Stroke Mother   . Diabetes Father   . Heart disease Father     Social History Social History  Substance Use Topics  . Smoking status: Former Smoker    Types: Cigarettes  . Smokeless tobacco: Not on file  . Alcohol Use: Yes     Comment: occas.    Review of Systems Constitutional: No fever/chills Eyes: No visual changes. ENT: No sore throat. No stiff neck no neck pain Cardiovascular: Denies chest pain. Respiratory: Denies shortness of breath. Gastrointestinal:   no vomiting.  No diarrhea.  No constipation. Genitourinary: Negative for dysuria. Musculoskeletal: Negative lower extremity swelling Skin: Negative for rash. Neurological: Negative for headaches, focal weakness or numbness. 10-point ROS otherwise negative.  ____________________________________________   PHYSICAL EXAM:  VITAL SIGNS: ED Triage Vitals  Enc Vitals Group     BP 06/15/15 0347 135/74 mmHg     Pulse Rate 06/15/15 0347 81     Resp  06/15/15 0347 16     Temp 06/15/15 0347 97.6 F (36.4 C)     Temp Source 06/15/15 0347 Oral     SpO2 06/15/15 0347 96 %     Weight 06/15/15 0347 155 lb (70.308 kg)     Height 06/15/15 0347 5\' 2"  (1.575 m)     Head Cir --      Peak Flow --      Pain Score 06/15/15 0345 8     Pain Loc --      Pain Edu? --      Excl. in GC? --     Constitutional: Alert and oriented. Well appearing and in no acute distress. Eyes: Conjunctivae are normal. PERRL. EOMI. Head: Atraumatic. Nose: No congestion/rhinnorhea. Mouth/Throat: Mucous membranes are moist.  Oropharynx non-erythematous. Neck: No stridor.   Nontender with no meningismus Cardiovascular: Normal rate, regular rhythm. Grossly normal heart sounds.  Good peripheral circulation. Respiratory: Normal respiratory effort.  No retractions. Lungs CTAB. Abdominal: Soft and nontender. No distention. No guarding no rebound Back:  There is no focal tenderness or step off there is no midline tenderness there are no lesions noted. there is no CVA tenderness Musculoskeletal: Right lower extremity shows some minimal tenderness to palpation in the right calf with no erythema and no swelling or redness, compartments are soft. Strong distal pulses. Strength and sensation are intact, normal reflexes. No saddle anesthesia. No joint effusions, no DVT signs strong distal pulses no edema Neurologic:  Normal speech and language. No gross focal neurologic deficits are appreciated.  Skin:  Skin is warm, dry and intact. No rash noted. Psychiatric: Mood and affect are normal. Speech and behavior are normal.  ____________________________________________   LABS (all labs ordered are listed, but only abnormal results are displayed)  Labs Reviewed  CBC WITH DIFFERENTIAL/PLATELET - Abnormal; Notable for the following:    MCV 79.9 (*)    RDW 15.3 (*)    All other components within normal limits  BASIC METABOLIC PANEL - Abnormal; Notable for the following:    Glucose,  Bld 137 (*)    Calcium 8.7 (*)    All other components within normal limits  CK   ____________________________________________  EKG   ____________________________________________  RADIOLOGY  I reviewed any imaging ordered by me or triage that were performed during my shift  ____________________________________________   PROCEDURES  Procedure(s) performed: None  Critical Care performed: None  ____________________________________________   INITIAL IMPRESSION / ASSESSMENT AND PLAN / ED COURSE  Pertinent labs & imaging results that were available during my care of the patient were reviewed by me and considered in my medical decision making (see chart for details).  Patient here with lower leg pain. There is some minimal tenderness to palpation there. The patient has a negative ultrasound for DVT, vital signs are reassuring there is no evidence of compartment syndrome, infection, cellulitis, there is no evidence of significant radicular pathology although this cannot be definitively ruled out. This certainly is no evidence of cauda equina syndrome, there is no evidence of bony lesion or pathologic fracture. In short, patient has a very reassuring workup for calf pain. I've examined her that there still could be the possibility of a DVT there that we do not see an that she needs to have the ocean repeated in a week. She understands she must return for chest pain shortness of breath increased pain, numbness or weakness, bowel or bladder or other concerns and this is all with exposed lesion comfortably explained to the patient. She will follow closely with her primary care doctor. ____________________________________________   FINAL CLINICAL IMPRESSION(S) / ED DIAGNOSES  Final diagnoses:  None     Jeanmarie Plant, MD 06/15/15 [phone]

## 2015-06-15 NOTE — ED Notes (Signed)
Report to felicia, rn.  

## 2015-07-28 ENCOUNTER — Emergency Department
Admission: EM | Admit: 2015-07-28 | Discharge: 2015-07-28 | Disposition: A | Discharge status: Home / Self Care | Payer: BLUE CROSS/BLUE SHIELD | Attending: Emergency Medicine | Admitting: Emergency Medicine

## 2015-07-28 DIAGNOSIS — I10 Essential (primary) hypertension: Secondary | ICD-10-CM | POA: Diagnosis not present

## 2015-07-28 DIAGNOSIS — Z7984 Long term (current) use of oral hypoglycemic drugs: Secondary | ICD-10-CM | POA: Diagnosis not present

## 2015-07-28 DIAGNOSIS — M5442 Lumbago with sciatica, left side: Secondary | ICD-10-CM | POA: Insufficient documentation

## 2015-07-28 DIAGNOSIS — M79605 Pain in left leg: Secondary | ICD-10-CM | POA: Diagnosis present

## 2015-07-28 DIAGNOSIS — E119 Type 2 diabetes mellitus without complications: Secondary | ICD-10-CM | POA: Insufficient documentation

## 2015-07-28 DIAGNOSIS — Z79899 Other long term (current) drug therapy: Secondary | ICD-10-CM | POA: Insufficient documentation

## 2015-07-28 DIAGNOSIS — Z880 Allergy status to penicillin: Secondary | ICD-10-CM | POA: Diagnosis not present

## 2015-07-28 DIAGNOSIS — Z87891 Personal history of nicotine dependence: Secondary | ICD-10-CM | POA: Diagnosis not present

## 2015-07-28 MED ORDER — DIAZEPAM 5 MG/ML IJ SOLN
5.0000 mg | Freq: Once | INTRAMUSCULAR | Status: AC
  Administered 2015-07-28: 5 mg via INTRAMUSCULAR
  Filled 2015-07-28: qty 2

## 2015-07-28 MED ORDER — OXYCODONE-ACETAMINOPHEN 5-325 MG PO TABS: 1.0000 | ORAL_TABLET | ORAL | Status: DC | PRN

## 2015-07-28 MED ORDER — IBUPROFEN 800 MG PO TABS: 800.0000 mg | ORAL_TABLET | Freq: Three times a day (TID) | ORAL | Status: DC | PRN

## 2015-07-28 MED ORDER — CYCLOBENZAPRINE HCL 10 MG PO TABS: 10.0000 mg | ORAL_TABLET | Freq: Three times a day (TID) | ORAL | Status: DC | PRN

## 2015-07-28 MED ORDER — HYDROMORPHONE HCL 1 MG/ML IJ SOLN
1.0000 mg | Freq: Once | INTRAMUSCULAR | Status: AC
  Administered 2015-07-28: 1 mg via INTRAMUSCULAR
  Filled 2015-07-28: qty 1

## 2015-07-28 MED ORDER — KETOROLAC TROMETHAMINE 60 MG/2ML IM SOLN
60.0000 mg | Freq: Once | INTRAMUSCULAR | Status: AC
  Administered 2015-07-28: 60 mg via INTRAMUSCULAR
  Filled 2015-07-28: qty 2

## 2015-07-28 NOTE — ED Notes (Signed)
Pt states she has pain to left leg that starts from her back and radiates down her leg.  Pt states she has been in bed since Thursday with a heating pad with minimal relief.  OTC medications are not effective either.  Does see Dr. Metta Clines for epidurals which are not helping.  Last one was in Dec and she states she stopped doing them because they are not working.

## 2015-07-28 NOTE — Discharge Instructions (Signed)
Chronic Back Pain ° When back pain lasts longer than 3 months, it is called chronic back pain. People with chronic back pain often go through certain periods that are more intense (flare-ups).  °CAUSES °Chronic back pain can be caused by wear and tear (degeneration) on different structures in your back. These structures include: °· The bones of your spine (vertebrae) and the joints surrounding your spinal cord and nerve roots (facets). °· The strong, fibrous tissues that connect your vertebrae (ligaments). °Degeneration of these structures may result in pressure on your nerves. This can lead to constant pain. °HOME CARE INSTRUCTIONS °· Avoid bending, heavy lifting, prolonged sitting, and activities which make the problem worse. °· Take brief periods of rest throughout the day to reduce your pain. Lying down or standing usually is better than sitting while you are resting. °· Take over-the-counter or prescription medicines only as directed by your caregiver. °SEEK IMMEDIATE MEDICAL CARE IF:  °· You have weakness or numbness in one of your legs or feet. °· You have trouble controlling your bladder or bowels. °· You have nausea, vomiting, abdominal pain, shortness of breath, or fainting. °  °This information is not intended to replace advice given to you by your health care provider. Make sure you discuss any questions you have with your health care provider. °  °Document Released: 06/25/2004 Document Revised: 08/10/2011 Document Reviewed: 11/05/2014 °Elsevier Interactive Patient Education ©2016 [employer] ° °Sciatica °Sciatica is pain, weakness, numbness, or tingling along the path of the sciatic nerve. The nerve starts in the lower back and runs down the back of each leg. The nerve controls the muscles in the lower leg and in the back of the knee, while also providing sensation to the back of the thigh, lower leg, and the sole of your foot. Sciatica is a symptom of another medical condition. For instance, nerve  damage or certain conditions, such as a herniated disk or bone spur on the spine, pinch or put pressure on the sciatic nerve. This causes the pain, weakness, or other sensations normally associated with sciatica. Generally, sciatica only affects one side of the body. °CAUSES  °· Herniated or slipped disc. °· Degenerative disk disease. °· A pain disorder involving the narrow muscle in the buttocks (piriformis syndrome). °· Pelvic injury or fracture. °· Pregnancy. °· Tumor (rare). °SYMPTOMS  °Symptoms can vary from mild to very severe. The symptoms usually travel from the low back to the buttocks and down the back of the leg. Symptoms can include: °· Mild tingling or dull aches in the lower back, leg, or hip. °· Numbness in the back of the calf or sole of the foot. °· Burning sensations in the lower back, leg, or hip. °· Sharp pains in the lower back, leg, or hip. °· Leg weakness. °· Severe back pain inhibiting movement. °These symptoms may get worse with coughing, sneezing, laughing, or prolonged sitting or standing. Also, being overweight may worsen symptoms. °DIAGNOSIS  °Your caregiver will perform a physical exam to look for common symptoms of sciatica. He or she may ask you to do certain movements or activities that would trigger sciatic nerve pain. Other tests may be performed to find the cause of the sciatica. These may include: °· Blood tests. °· X-rays. °· Imaging tests, such as an MRI or CT scan. °TREATMENT  °Treatment is directed at the cause of the sciatic pain. Sometimes, treatment is not necessary and the pain and discomfort goes away on its own. If treatment is needed, your   caregiver may suggest: °· Over-the-counter medicines to relieve pain. °· Prescription medicines, such as anti-inflammatory medicine, muscle relaxants, or narcotics. °· Applying heat or ice to the painful area. °· Steroid injections to lessen pain, irritation, and inflammation around the nerve. °· Reducing activity during periods of  pain. °· Exercising and stretching to strengthen your abdomen and improve flexibility of your spine. Your caregiver may suggest losing weight if the extra weight makes the back pain worse. °· Physical therapy. °· Surgery to eliminate what is pressing or pinching the nerve, such as a bone spur or part of a herniated disk. °HOME CARE INSTRUCTIONS  °· Only take over-the-counter or prescription medicines for pain or discomfort as directed by your caregiver. °· Apply ice to the affected area for 20 minutes, 3-4 times a day for the first 48-72 hours. Then try heat in the same way. °· Exercise, stretch, or perform your usual activities if these do not aggravate your pain. °· Attend physical therapy sessions as directed by your caregiver. °· Keep all follow-up appointments as directed by your caregiver. °· Do not wear high heels or shoes that do not provide proper support. °· Check your mattress to see if it is too soft. A firm mattress may lessen your pain and discomfort. °SEEK IMMEDIATE MEDICAL CARE IF:  °· You lose control of your bowel or bladder (incontinence). °· You have increasing weakness in the lower back, pelvis, buttocks, or legs. °· You have redness or swelling of your back. °· You have a burning sensation when you urinate. °· You have pain that gets worse when you lie down or awakens you at night. °· Your pain is worse than you have experienced in the past. °· Your pain is lasting longer than 4 weeks. °· You are suddenly losing weight without reason. °MAKE SURE YOU: °· Understand these instructions. °· Will watch your condition. °· Will get help right away if you are not doing well or get worse. °  °This information is not intended to replace advice given to you by your health care provider. Make sure you discuss any questions you have with your health care provider. °  °Document Released: 05/12/2001 Document Revised: 02/06/2015 Document Reviewed: 09/27/2011 °Elsevier Interactive Patient Education ©2016  Elsevier Inc. ° °

## 2015-07-28 NOTE — ED Provider Notes (Signed)
[facility] Emergency Department Provider Note  ____________________________________________  Time seen: Approximately 8:46 AM  I have reviewed the triage vital signs and the nursing notes.   HISTORY  Chief Complaint Leg Pain    HPI Christy Mcbride is a 55 y.o. female presents for sudden evaluation of left leg pain 3 days. Patient states onset late Wednesday night early Thursday morning. Has been trying heating pad and over-the-counter medications with no relief. Has been seeing pain management for epidurals which is not helping. Patient reports that she fell Wednesday which she thinks exacerbated her pain. Has been encouraged to have spinal surgery but financially cannot afford to be off of work at this point. Describes her pain as an 8/10radiating down the left leg. Denies any numbness or tingling. Worse with movement and standing.   Past Medical History  Diagnosis Date  . Diabetes mellitus without complication (HCC)   . Thyroid disease   . Liver disease   . Herniated cervical disc   . Hypertension   . Allergy     Patient Active Problem List   Diagnosis Date Noted  . DDD (degenerative disc disease), lumbar 03/19/2015  . Lumbar radiculopathy 03/19/2015  . Facet syndrome, lumbar 03/19/2015  . Bilateral occipital neuralgia 03/19/2015    Past Surgical History  Procedure Laterality Date  . Ablation    . Cholecystectomy    . Cesarean section    . Liver biopsy      Current Outpatient Rx  Name  Route  Sig  Dispense  Refill  . cyclobenzaprine (FLEXERIL) 10 MG tablet   Oral   Take 1 tablet (10 mg total) by mouth every 8 (eight) hours as needed for muscle spasms.   30 tablet   1   . ERGOCALCIFEROL PO   Oral   Take 1.25 mg by mouth once a week.         . Estradiol-Norethindrone Acet 0.5-0.1 MG per tablet   Oral   Take 1 tablet by mouth daily. Reported on 05/28/2015         . furosemide (LASIX) 20 MG tablet   Oral   Take 20 mg by mouth as  needed.          Marland Kitchen ibuprofen (ADVIL,MOTRIN) 800 MG tablet   Oral   Take 1 tablet (800 mg total) by mouth every 8 (eight) hours as needed.   30 tablet   0   . metFORMIN (GLUMETZA) 500 MG (MOD) 24 hr tablet   Oral   Take 500 mg by mouth daily with breakfast.         . metoCLOPramide (REGLAN) 10 MG tablet   Oral   Take 1 tablet (10 mg total) by mouth 4 (four) times daily -  before meals and at bedtime.   60 tablet   0   . omeprazole (PRILOSEC) 20 MG capsule   Oral   Take 20 mg by mouth 2 (two) times daily before a meal.         . oxyCODONE-acetaminophen (ROXICET) 5-325 MG tablet   Oral   Take 1-2 tablets by mouth every 4 (four) hours as needed for severe pain.   15 tablet   0   . PARoxetine (PAXIL) 40 MG tablet   Oral   Take 40 mg by mouth daily.           Allergies Sulfa antibiotics; Fentanyl; Penicillins; and Topiramate  Family History  Problem Relation Age of Onset  . Hypertension Mother   .  Stroke Mother   . Diabetes Father   . Heart disease Father     Social History Social History  Substance Use Topics  . Smoking status: Former Smoker    Types: Cigarettes  . Smokeless tobacco: Not on file  . Alcohol Use: Yes     Comment: occas.    Review of Systems Constitutional: No fever/chills Eyes: No visual changes. ENT: No sore throat. Cardiovascular: Denies chest pain. Respiratory: Denies shortness of breath. Gastrointestinal: No abdominal pain.  No nausea, no vomiting.  No diarrhea.  No constipation. Genitourinary: Negative for dysuria. Musculoskeletal: Negative for back pain. Skin: Negative for rash. Neurological: Negative for headaches, focal weakness or numbness.  10-point ROS otherwise negative.  ____________________________________________   PHYSICAL EXAM:  VITAL SIGNS: ED Triage Vitals  Enc Vitals Group     BP 07/28/15 0832 158/83 mmHg     Pulse Rate 07/28/15 0832 90     Resp 07/28/15 0832 18     Temp 07/28/15 0832 97.9 F (36.6  C)     Temp Source 07/28/15 0832 Oral     SpO2 07/28/15 0832 98 %     Weight 07/28/15 0832 150 lb (68.04 kg)     Height 07/28/15 0832  (1.575 m)     Head Cir --      Peak Flow --      Pain Score 07/28/15 0832 8     Pain Loc --      Pain Edu? --      Excl. in GC? --     Constitutional: Alert and oriented. Well appearing and in no acute distress. Cardiovascular: Normal rate, regular rhythm. Grossly normal heart sounds.  Good peripheral circulation. Respiratory: Normal respiratory effort.  No retractions. Lungs CTAB. Gastrointestinal: Soft and nontender. No distention. No abdominal bruits. No CVA tenderness. Musculoskeletal: No lower extremity tenderness nor edema.  No joint effusions. Straight leg raise positive bilaterally left at approximately 30 and right at about 60. Distally neurovascularly intact bilaterally. Positive left CVAT but no suprapubic tenderness. Neurologic:  Normal speech and language. No gross focal neurologic deficits are appreciated. No gait instability. Skin:  Skin is warm, dry and intact. No rash noted. Psychiatric: Mood and affect are normal. Speech and behavior are normal.  ____________________________________________   LABS (all labs ordered are listed, but only abnormal results are displayed)  Labs Reviewed - No data to display ____________________________________________   PROCEDURES  Procedure(s) performed: None  Critical Care performed: No  ____________________________________________   INITIAL IMPRESSION / ASSESSMENT AND PLAN / ED COURSE  Pertinent labs & imaging results that were available during my care of the patient were reviewed by me and considered in my medical decision making (see chart for details).  Acute exacerbation of low back and leg pain. Patient received Dilaudid 1 mg and Valium 5 mg IM, Toradol 60 mg while in the ED with some relief. Patient discharged home with Flexeril 10 mg 3 times a day ibuprofen 800 mg 3 times a  day and Vicodin 5/325. Patient follow-up with her PCP or return to the ER with any worsening symptomology. Encouraged her to keep her specialist appointment for possible additional epidural blocks.  She voices no other emergency medical complaints at this time. ____________________________________________   FINAL CLINICAL IMPRESSION(S) / ED DIAGNOSES  Final diagnoses:  Left-sided low back pain with left-sided sciatica     This chart was dictated using voice recognition software/Dragon. Despite best efforts to proofread, errors can occur which can change the meaning.  Any change was purely unintentional.   Evangeline Dakin, PA-C 07/28/15 1013  Jene Every, MD 07/28/15 [phone]

## 2015-07-28 NOTE — ED Notes (Signed)
Pt instructed to follow up with PCP and continue to see Dr. Metta Clines for epidural injections.  Patient went home with sister.

## 2015-07-28 NOTE — ED Notes (Signed)
Pt reports pain to left leg that started Wednesday night. Pt reports falling Wednesday night. Pt states the pain starts in her left hip and shoots down to her foot.

## 2015-09-03 ENCOUNTER — Other Ambulatory Visit: Payer: Self-pay | Admitting: Family Medicine

## 2015-09-03 ENCOUNTER — Ambulatory Visit
Admission: RE | Admit: 2015-09-03 | Discharge: 2015-09-03 | Disposition: A | Discharge status: Home / Self Care | Payer: BLUE CROSS/BLUE SHIELD | Source: Ambulatory Visit | Attending: Family Medicine | Admitting: Family Medicine

## 2015-09-03 DIAGNOSIS — K3189 Other diseases of stomach and duodenum: Secondary | ICD-10-CM | POA: Insufficient documentation

## 2015-09-03 DIAGNOSIS — R109 Unspecified abdominal pain: Secondary | ICD-10-CM | POA: Diagnosis present

## 2015-09-03 DIAGNOSIS — K760 Fatty (change of) liver, not elsewhere classified: Secondary | ICD-10-CM | POA: Insufficient documentation

## 2015-09-03 DIAGNOSIS — M5136 Other intervertebral disc degeneration, lumbar region: Secondary | ICD-10-CM | POA: Diagnosis not present

## 2015-09-03 DIAGNOSIS — Q631 Lobulated, fused and horseshoe kidney: Secondary | ICD-10-CM | POA: Diagnosis not present

## 2015-09-03 LAB — POCT I-STAT CREATININE: Creatinine, Ser: 0.7 mg/dL (ref 0.44–1.00)

## 2015-09-03 MED ORDER — IOPAMIDOL (ISOVUE-300) INJECTION 61%
100.0000 mL | Freq: Once | INTRAVENOUS | Status: AC | PRN
  Administered 2015-09-03: 100 mL via INTRAVENOUS

## 2015-09-12 ENCOUNTER — Encounter: Payer: Self-pay | Admitting: Emergency Medicine

## 2015-09-12 ENCOUNTER — Inpatient Hospital Stay
Admission: EM | Admit: 2015-09-12 | Discharge: 2015-09-14 | DRG: 393 | Disposition: A | Discharge status: Home / Self Care | Payer: BLUE CROSS/BLUE SHIELD | Attending: Internal Medicine | Admitting: Internal Medicine

## 2015-09-12 DIAGNOSIS — K2971 Gastritis, unspecified, with bleeding: Secondary | ICD-10-CM | POA: Diagnosis present

## 2015-09-12 DIAGNOSIS — K317 Polyp of stomach and duodenum: Principal | ICD-10-CM | POA: Diagnosis present

## 2015-09-12 DIAGNOSIS — R740 Nonspecific elevation of levels of transaminase and lactic acid dehydrogenase [LDH]: Secondary | ICD-10-CM | POA: Diagnosis present

## 2015-09-12 DIAGNOSIS — K3189 Other diseases of stomach and duodenum: Secondary | ICD-10-CM | POA: Diagnosis present

## 2015-09-12 DIAGNOSIS — Z79899 Other long term (current) drug therapy: Secondary | ICD-10-CM

## 2015-09-12 DIAGNOSIS — M79606 Pain in leg, unspecified: Secondary | ICD-10-CM | POA: Diagnosis present

## 2015-09-12 DIAGNOSIS — Z8249 Family history of ischemic heart disease and other diseases of the circulatory system: Secondary | ICD-10-CM

## 2015-09-12 DIAGNOSIS — Z888 Allergy status to other drugs, medicaments and biological substances status: Secondary | ICD-10-CM

## 2015-09-12 DIAGNOSIS — K922 Gastrointestinal hemorrhage, unspecified: Secondary | ICD-10-CM | POA: Diagnosis present

## 2015-09-12 DIAGNOSIS — Z885 Allergy status to narcotic agent status: Secondary | ICD-10-CM

## 2015-09-12 DIAGNOSIS — Z791 Long term (current) use of non-steroidal anti-inflammatories (NSAID): Secondary | ICD-10-CM

## 2015-09-12 DIAGNOSIS — Z882 Allergy status to sulfonamides status: Secondary | ICD-10-CM

## 2015-09-12 DIAGNOSIS — Z7984 Long term (current) use of oral hypoglycemic drugs: Secondary | ICD-10-CM

## 2015-09-12 DIAGNOSIS — E079 Disorder of thyroid, unspecified: Secondary | ICD-10-CM | POA: Diagnosis present

## 2015-09-12 DIAGNOSIS — G8929 Other chronic pain: Secondary | ICD-10-CM | POA: Diagnosis present

## 2015-09-12 DIAGNOSIS — Z823 Family history of stroke: Secondary | ICD-10-CM

## 2015-09-12 DIAGNOSIS — E876 Hypokalemia: Secondary | ICD-10-CM | POA: Diagnosis present

## 2015-09-12 DIAGNOSIS — I10 Essential (primary) hypertension: Secondary | ICD-10-CM | POA: Diagnosis present

## 2015-09-12 DIAGNOSIS — E119 Type 2 diabetes mellitus without complications: Secondary | ICD-10-CM | POA: Diagnosis present

## 2015-09-12 DIAGNOSIS — M5020 Other cervical disc displacement, unspecified cervical region: Secondary | ICD-10-CM | POA: Diagnosis present

## 2015-09-12 DIAGNOSIS — Z8711 Personal history of peptic ulcer disease: Secondary | ICD-10-CM

## 2015-09-12 DIAGNOSIS — Z833 Family history of diabetes mellitus: Secondary | ICD-10-CM

## 2015-09-12 DIAGNOSIS — Z880 Allergy status to penicillin: Secondary | ICD-10-CM

## 2015-09-12 DIAGNOSIS — Z794 Long term (current) use of insulin: Secondary | ICD-10-CM

## 2015-09-12 DIAGNOSIS — M5116 Intervertebral disc disorders with radiculopathy, lumbar region: Secondary | ICD-10-CM | POA: Diagnosis present

## 2015-09-12 DIAGNOSIS — Z87891 Personal history of nicotine dependence: Secondary | ICD-10-CM

## 2015-09-12 DIAGNOSIS — K7581 Nonalcoholic steatohepatitis (NASH): Secondary | ICD-10-CM | POA: Diagnosis present

## 2015-09-12 HISTORY — DX: Sleep apnea, unspecified: G47.30

## 2015-09-12 LAB — URINALYSIS COMPLETE WITH MICROSCOPIC (ARMC ONLY)
BILIRUBIN URINE: NEGATIVE
GLUCOSE, UA: NEGATIVE mg/dL
KETONES UR: NEGATIVE mg/dL
LEUKOCYTES UA: NEGATIVE
NITRITE: NEGATIVE
PROTEIN: NEGATIVE mg/dL
Specific Gravity, Urine: 1.006 (ref 1.005–1.030)
pH: 5 (ref 5.0–8.0)

## 2015-09-12 LAB — CBC
HEMATOCRIT: 41.5 % (ref 35.0–47.0)
Hemoglobin: 13.6 g/dL (ref 12.0–16.0)
MCH: 26.1 pg (ref 26.0–34.0)
MCHC: 32.8 g/dL (ref 32.0–36.0)
MCV: 79.5 fL — ABNORMAL LOW (ref 80.0–100.0)
Platelets: 306 10*3/uL (ref 150–440)
RBC: 5.22 MIL/uL — ABNORMAL HIGH (ref 3.80–5.20)
RDW: 13.7 % (ref 11.5–14.5)
WBC: 8.8 10*3/uL (ref 3.6–11.0)

## 2015-09-12 LAB — COMPREHENSIVE METABOLIC PANEL
ALT: 50 U/L (ref 14–54)
ANION GAP: 10 (ref 5–15)
AST: 69 U/L — AB (ref 15–41)
Albumin: 4.8 g/dL (ref 3.5–5.0)
Alkaline Phosphatase: 87 U/L (ref 38–126)
BILIRUBIN TOTAL: 0.7 mg/dL (ref 0.3–1.2)
BUN: 9 mg/dL (ref 6–20)
CO2: 25 mmol/L (ref 22–32)
Calcium: 9.4 mg/dL (ref 8.9–10.3)
Chloride: 105 mmol/L (ref 101–111)
Creatinine, Ser: 0.46 mg/dL (ref 0.44–1.00)
GFR calc Af Amer: >60 mL/min (ref >60)
Glucose, Bld: 113 mg/dL — ABNORMAL HIGH (ref 65–99)
POTASSIUM: 3 mmol/L — AB (ref 3.5–5.1)
Sodium: 140 mmol/L (ref 135–145)
TOTAL PROTEIN: 8.1 g/dL (ref 6.5–8.1)

## 2015-09-12 LAB — LIPASE, BLOOD: Lipase: 18 U/L (ref 11–51)

## 2015-09-12 MED ORDER — ONDANSETRON HCL 4 MG/2ML IJ SOLN
4.0000 mg | Freq: Once | INTRAMUSCULAR | Status: AC
Start: 2015-09-12 — End: 2015-09-13
  Administered 2015-09-13: 4 mg via INTRAVENOUS
  Filled 2015-09-12: qty 2

## 2015-09-12 MED ORDER — MORPHINE SULFATE (PF) 4 MG/ML IV SOLN
4.0000 mg | Freq: Once | INTRAVENOUS | Status: AC
  Administered 2015-09-13: 4 mg via INTRAVENOUS
  Filled 2015-09-12: qty 1

## 2015-09-12 MED ORDER — SODIUM CHLORIDE 0.9 % IV BOLUS (SEPSIS)
1000.0000 mL | Freq: Once | INTRAVENOUS | Status: AC
Start: 2015-09-12 — End: 2015-09-13
  Administered 2015-09-13: 1000 mL via INTRAVENOUS

## 2015-09-12 MED ORDER — ONDANSETRON 4 MG PO TBDP
4.0000 mg | ORAL_TABLET | Freq: Once | ORAL | Status: AC | PRN
  Administered 2015-09-12: 4 mg via ORAL

## 2015-09-12 MED ORDER — SODIUM CHLORIDE 0.9 % IV SOLN
8.0000 mg/h | INTRAVENOUS | Status: DC
  Administered 2015-09-13 (×2): 8 mg/h via INTRAVENOUS
  Filled 2015-09-12 (×2): qty 80

## 2015-09-12 MED ORDER — PANTOPRAZOLE SODIUM 40 MG IV SOLR
80.0000 mg | Freq: Once | INTRAVENOUS | Status: AC
  Administered 2015-09-13: 80 mg via INTRAVENOUS
  Filled 2015-09-12: qty 80

## 2015-09-12 MED ORDER — ONDANSETRON 4 MG PO TBDP
ORAL_TABLET | ORAL | Status: AC
  Filled 2015-09-12: qty 1

## 2015-09-12 NOTE — ED Provider Notes (Signed)
[facility] Emergency Department Provider Note  Time seen: 11:46 PM  I have reviewed the triage vital signs and the nursing notes.   HISTORY  Chief Complaint Hematemesis and Abdominal Pain    HPI Christy Mcbride is a 55 y.o. female with a past medical history of diabetes, hypertension, liver disease, presents the emergency department with abdominal pain and nausea and vomiting as well as hematemesis. According to the patient for the past 1-2 months she has had intermittent diffuse abdominal pain, and vomiting after meals. States a history of peptic ulcer disease.  States for the past 2-3 weeks she has been vomiting after all meals, and is having trouble keeping any food down. States her pain acutely worsened tonight, she got nauseated and vomited and states it was bright red blood 2. Has continued to dry heave at times but denies any further vomiting. States she has had intermittent dark stools for the past several weeks but denies any bright red stools. Describes her abdominal pain as an 8/10 dull aching sensation diffusely across the upper abdomen.     Past Medical History  Diagnosis Date  . Diabetes mellitus without complication (HCC)   . Thyroid disease   . Liver disease   . Herniated cervical disc   . Hypertension   . Allergy     Patient Active Problem List   Diagnosis Date Noted  . DDD (degenerative disc disease), lumbar 03/19/2015  . Lumbar radiculopathy 03/19/2015  . Facet syndrome, lumbar 03/19/2015  . Bilateral occipital neuralgia 03/19/2015    Past Surgical History  Procedure Laterality Date  . Ablation    . Cholecystectomy    . Cesarean section    . Liver biopsy      Current Outpatient Rx  Name  Route  Sig  Dispense  Refill  . cyclobenzaprine (FLEXERIL) 10 MG tablet   Oral   Take 1 tablet (10 mg total) by mouth every 8 (eight) hours as needed for muscle spasms.   30 tablet   1   . ERGOCALCIFEROL PO   Oral   Take 1.25 mg by mouth  once a week.         . Estradiol-Norethindrone Acet 0.5-0.1 MG per tablet   Oral   Take 1 tablet by mouth daily. Reported on 05/28/2015         . furosemide (LASIX) 20 MG tablet   Oral   Take 20 mg by mouth as needed.          Marland Kitchen. ibuprofen (ADVIL,MOTRIN) 800 MG tablet   Oral   Take 1 tablet (800 mg total) by mouth every 8 (eight) hours as needed.   30 tablet   0   . metFORMIN (GLUMETZA) 500 MG (MOD) 24 hr tablet   Oral   Take 500 mg by mouth daily with breakfast.         . metoCLOPramide (REGLAN) 10 MG tablet   Oral   Take 1 tablet (10 mg total) by mouth 4 (four) times daily -  before meals and at bedtime.   60 tablet   0   . omeprazole (PRILOSEC) 20 MG capsule   Oral   Take 20 mg by mouth 2 (two) times daily before a meal.         . oxyCODONE-acetaminophen (ROXICET) 5-325 MG tablet   Oral   Take 1-2 tablets by mouth every 4 (four) hours as needed for severe pain.   15 tablet   0   . PARoxetine (  PAXIL) 40 MG tablet   Oral   Take 40 mg by mouth daily.           Allergies Sulfa antibiotics; Fentanyl; Penicillins; and Topiramate  Family History  Problem Relation Age of Onset  . Hypertension Mother   . Stroke Mother   . Diabetes Father   . Heart disease Father     Social History Social History  Substance Use Topics  . Smoking status: Former Smoker    Types: Cigarettes  . Smokeless tobacco: None  . Alcohol Use: Yes     Comment: occas.    Review of Systems Constitutional: Negative for fever. Cardiovascular: Negative for chest pain. Respiratory: Negative for shortness of breath. Gastrointestinal: Moderate upper abdominal pain. Positive for nausea and vomiting with hematemesis. Genitourinary: Negative for dysuria. Musculoskeletal: Negative for back pain Neurological: Negative for headache 10-point ROS otherwise negative.  ____________________________________________   PHYSICAL EXAM:  VITAL SIGNS: ED Triage Vitals  Enc Vitals Group      BP 09/12/15 2121 176/81 mmHg     Pulse Rate 09/12/15 2121 89     Resp 09/12/15 2121 20     Temp 09/12/15 2121 98.1 F (36.7 C)     Temp Source 09/12/15 2121 Oral     SpO2 09/12/15 2121 95 %     Weight 09/12/15 2121 150 lb (68.04 kg)     Height 09/12/15 2121  (1.575 m)     Head Cir --      Peak Flow --      Pain Score 09/12/15 2122 9     Pain Loc --      Pain Edu? --      Excl. in GC? --     Constitutional: Alert and oriented. Well appearing and in no distress. Eyes: Normal exam ENT   Head: Normocephalic and atraumatic.   Mouth/Throat: Mucous membranes are moist. Cardiovascular: Normal rate, regular rhythm. No murmur Respiratory: Normal respiratory effort without tachypnea nor retractions. Breath sounds are clear  Gastrointestinal: Soft, moderate upper abdominal tenderness palpation. No rebound or guarding. No distention. Mild diffuse abdominal tenderness to palpation. Musculoskeletal: Nontender with normal range of motion in all extremities.  Neurologic:  Normal speech and language. No gross focal neurologic deficits Skin:  Skin is warm, dry and intact.  Psychiatric: Mood and affect are normal.   ____________________________________________    INITIAL IMPRESSION / ASSESSMENT AND PLAN / ED COURSE  Pertinent labs & imaging results that were available during my care of the patient were reviewed by me and considered in my medical decision making (see chart for details).  Patient presents the emergency department with several weeks of upper abdominal pain and vomiting after all meals. States the abdominal pain worsened today and she had 2 episodes of hematemesis. Denies any dry heaving or vomiting before the hematemesis. Patient has a history of peptic ulcer disease. Had a CT performed by her primary care physician approximately one week ago showing a markedly distended stomach otherwise normal. Given the distended stomach and vomiting after meals a highly suspect the  patient has a degree of gastric outlet obstruction due to her peptic ulcer disease. However given the patient's worsening pain along with hematemesis today I believe the patient requires admission to the hospital for further workup and treatment. I started the patient on a Protonix bolus and drip, IV fluids, pain and nausea medication.  X-ray does not appear to show any free air or other abnormality. Patient will be admitted to the hospitalist  service for further workup. I discussed the patient with GI medicine as well who will consult on the patient.  ____________________________________________   FINAL CLINICAL IMPRESSION(S) / ED DIAGNOSES  Upper GI bleed Abdominal pain   Minna Antis, MD 09/13/15 0031

## 2015-09-12 NOTE — ED Notes (Signed)
Patient reports that she has had generalized abd pain and swelling times three weeks. Patient reports that she has seen her pcp for the abd pain and was taken off a diabetic medication because her pcp thought it maybe causing pancreatitis. Patient reports that she had a ct scan about a week ago that only showed swelling in her abd. Patient states that tonight she vomited bright red blood.

## 2015-09-12 NOTE — ED Notes (Signed)
Pt reports she was started on a new DM medication 2 months ago and started to have swelling in her abdomen 3 weeks ago, decreased appetite and today started to vomit blood. Pt states she went to see her pcp last week and had a CT scan done. PCP told pt there was "alot of swelling at the stomach," per pt.

## 2015-09-13 ENCOUNTER — Ambulatory Visit: Payer: 59

## 2015-09-13 ENCOUNTER — Emergency Department: Payer: BLUE CROSS/BLUE SHIELD

## 2015-09-13 DIAGNOSIS — K7581 Nonalcoholic steatohepatitis (NASH): Secondary | ICD-10-CM | POA: Diagnosis present

## 2015-09-13 DIAGNOSIS — I10 Essential (primary) hypertension: Secondary | ICD-10-CM | POA: Diagnosis present

## 2015-09-13 DIAGNOSIS — Z8249 Family history of ischemic heart disease and other diseases of the circulatory system: Secondary | ICD-10-CM | POA: Diagnosis not present

## 2015-09-13 DIAGNOSIS — K922 Gastrointestinal hemorrhage, unspecified: Secondary | ICD-10-CM | POA: Diagnosis present

## 2015-09-13 DIAGNOSIS — M5116 Intervertebral disc disorders with radiculopathy, lumbar region: Secondary | ICD-10-CM | POA: Diagnosis present

## 2015-09-13 DIAGNOSIS — G8929 Other chronic pain: Secondary | ICD-10-CM | POA: Diagnosis present

## 2015-09-13 DIAGNOSIS — Z880 Allergy status to penicillin: Secondary | ICD-10-CM | POA: Diagnosis not present

## 2015-09-13 DIAGNOSIS — Z833 Family history of diabetes mellitus: Secondary | ICD-10-CM | POA: Diagnosis not present

## 2015-09-13 DIAGNOSIS — Z823 Family history of stroke: Secondary | ICD-10-CM | POA: Diagnosis not present

## 2015-09-13 DIAGNOSIS — Z882 Allergy status to sulfonamides status: Secondary | ICD-10-CM | POA: Diagnosis not present

## 2015-09-13 DIAGNOSIS — E119 Type 2 diabetes mellitus without complications: Secondary | ICD-10-CM | POA: Diagnosis present

## 2015-09-13 DIAGNOSIS — M79606 Pain in leg, unspecified: Secondary | ICD-10-CM | POA: Diagnosis present

## 2015-09-13 DIAGNOSIS — Z791 Long term (current) use of non-steroidal anti-inflammatories (NSAID): Secondary | ICD-10-CM | POA: Diagnosis not present

## 2015-09-13 DIAGNOSIS — M5020 Other cervical disc displacement, unspecified cervical region: Secondary | ICD-10-CM | POA: Diagnosis present

## 2015-09-13 DIAGNOSIS — Z885 Allergy status to narcotic agent status: Secondary | ICD-10-CM | POA: Diagnosis not present

## 2015-09-13 DIAGNOSIS — Z8711 Personal history of peptic ulcer disease: Secondary | ICD-10-CM | POA: Diagnosis not present

## 2015-09-13 DIAGNOSIS — Z888 Allergy status to other drugs, medicaments and biological substances status: Secondary | ICD-10-CM | POA: Diagnosis not present

## 2015-09-13 DIAGNOSIS — K3189 Other diseases of stomach and duodenum: Secondary | ICD-10-CM | POA: Diagnosis present

## 2015-09-13 DIAGNOSIS — Z7984 Long term (current) use of oral hypoglycemic drugs: Secondary | ICD-10-CM | POA: Diagnosis not present

## 2015-09-13 DIAGNOSIS — K317 Polyp of stomach and duodenum: Secondary | ICD-10-CM | POA: Diagnosis present

## 2015-09-13 DIAGNOSIS — Z79899 Other long term (current) drug therapy: Secondary | ICD-10-CM | POA: Diagnosis not present

## 2015-09-13 DIAGNOSIS — K2971 Gastritis, unspecified, with bleeding: Secondary | ICD-10-CM | POA: Diagnosis present

## 2015-09-13 DIAGNOSIS — Z794 Long term (current) use of insulin: Secondary | ICD-10-CM | POA: Diagnosis not present

## 2015-09-13 DIAGNOSIS — Z87891 Personal history of nicotine dependence: Secondary | ICD-10-CM | POA: Diagnosis not present

## 2015-09-13 DIAGNOSIS — E876 Hypokalemia: Secondary | ICD-10-CM | POA: Diagnosis present

## 2015-09-13 DIAGNOSIS — E079 Disorder of thyroid, unspecified: Secondary | ICD-10-CM | POA: Diagnosis present

## 2015-09-13 DIAGNOSIS — R740 Nonspecific elevation of levels of transaminase and lactic acid dehydrogenase [LDH]: Secondary | ICD-10-CM | POA: Diagnosis present

## 2015-09-13 LAB — CBC
HEMATOCRIT: 38.5 % (ref 35.0–47.0)
Hemoglobin: 12.5 g/dL (ref 12.0–16.0)
MCH: 26.6 pg (ref 26.0–34.0)
MCHC: 32.5 g/dL (ref 32.0–36.0)
MCV: 82 fL (ref 80.0–100.0)
PLATELETS: 300 10*3/uL (ref 150–440)
RBC: 4.7 MIL/uL (ref 3.80–5.20)
RDW: 14 % (ref 11.5–14.5)
WBC: 9.9 10*3/uL (ref 3.6–11.0)

## 2015-09-13 LAB — GLUCOSE, CAPILLARY
GLUCOSE-CAPILLARY: 79 mg/dL (ref 65–99)
Glucose-Capillary: 134 mg/dL — ABNORMAL HIGH (ref 65–99)
Glucose-Capillary: 151 mg/dL — ABNORMAL HIGH (ref 65–99)
Glucose-Capillary: 104 mg/dL — ABNORMAL HIGH (ref 65–99)
Glucose-Capillary: 88 mg/dL (ref 65–99)

## 2015-09-13 MED ORDER — HYDROMORPHONE HCL 1 MG/ML IJ SOLN
INTRAMUSCULAR | Status: AC
  Administered 2015-09-13: 1 mg via INTRAVENOUS
  Filled 2015-09-13: qty 1

## 2015-09-13 MED ORDER — CYCLOBENZAPRINE HCL 10 MG PO TABS: 10.0000 mg | ORAL_TABLET | Freq: Three times a day (TID) | ORAL | Status: DC | PRN

## 2015-09-13 MED ORDER — PAROXETINE HCL 20 MG PO TABS
40.0000 mg | ORAL_TABLET | Freq: Every day | ORAL | Status: DC
  Administered 2015-09-13 – 2015-09-14 (×2): 40 mg via ORAL
  Filled 2015-09-13 (×2): qty 2

## 2015-09-13 MED ORDER — HYDROMORPHONE HCL 1 MG/ML IJ SOLN
1.0000 mg | Freq: Once | INTRAMUSCULAR | Status: AC
  Administered 2015-09-13: 1 mg via INTRAVENOUS

## 2015-09-13 MED ORDER — POTASSIUM CHLORIDE IN NACL 20-0.9 MEQ/L-% IV SOLN
INTRAVENOUS | Status: DC
  Administered 2015-09-13 – 2015-09-14 (×3): via INTRAVENOUS
  Filled 2015-09-13 (×5): qty 1000

## 2015-09-13 MED ORDER — INSULIN ASPART 100 UNIT/ML ~~LOC~~ SOLN
0.0000 [IU] | Freq: Three times a day (TID) | SUBCUTANEOUS | Status: DC
  Administered 2015-09-13: 2 [IU] via SUBCUTANEOUS
  Administered 2015-09-14: 1 [IU] via SUBCUTANEOUS
  Filled 2015-09-13: qty 2
  Filled 2015-09-13: qty 1

## 2015-09-13 MED ORDER — ACETAMINOPHEN 325 MG PO TABS
650.0000 mg | ORAL_TABLET | Freq: Four times a day (QID) | ORAL | Status: DC | PRN
  Administered 2015-09-13 (×2): 650 mg via ORAL
  Filled 2015-09-13 (×2): qty 2

## 2015-09-13 MED ORDER — ONDANSETRON HCL 4 MG PO TABS: 4.0000 mg | ORAL_TABLET | Freq: Four times a day (QID) | ORAL | Status: DC | PRN

## 2015-09-13 MED ORDER — ROPINIROLE HCL 0.25 MG PO TABS
1.0000 mg | ORAL_TABLET | Freq: Every day | ORAL | Status: DC
  Administered 2015-09-13 (×2): 1 mg via ORAL
  Filled 2015-09-13: qty 1
  Filled 2015-09-13: qty 4
  Filled 2015-09-13: qty 1

## 2015-09-13 MED ORDER — PANTOPRAZOLE SODIUM 40 MG IV SOLR
40.0000 mg | Freq: Two times a day (BID) | INTRAVENOUS | Status: DC
  Administered 2015-09-13 – 2015-09-14 (×2): 40 mg via INTRAVENOUS
  Filled 2015-09-13: qty 40

## 2015-09-13 MED ORDER — SODIUM CHLORIDE 0.9 % IV SOLN: INTRAVENOUS | Status: DC

## 2015-09-13 MED ORDER — BUTALBITAL-APAP-CAFFEINE 50-325-40 MG PO TABS
2.0000 | ORAL_TABLET | Freq: Four times a day (QID) | ORAL | Status: DC | PRN
  Administered 2015-09-13 (×2): 2 via ORAL
  Filled 2015-09-13 (×3): qty 2

## 2015-09-13 MED ORDER — DIAZEPAM 5 MG PO TABS: 5.0000 mg | ORAL_TABLET | Freq: Two times a day (BID) | ORAL | Status: DC | PRN

## 2015-09-13 MED ORDER — ACETAMINOPHEN 650 MG RE SUPP: 650.0000 mg | Freq: Four times a day (QID) | RECTAL | Status: DC | PRN

## 2015-09-13 MED ORDER — MORPHINE SULFATE (PF) 2 MG/ML IV SOLN
1.0000 mg | INTRAVENOUS | Status: DC | PRN
  Administered 2015-09-13 – 2015-09-14 (×2): 1 mg via INTRAVENOUS
  Filled 2015-09-13 (×2): qty 1

## 2015-09-13 MED ORDER — TIZANIDINE HCL 4 MG PO TABS
4.0000 mg | ORAL_TABLET | Freq: Four times a day (QID) | ORAL | Status: DC | PRN
  Administered 2015-09-13: 4 mg via ORAL
  Filled 2015-09-13: qty 1

## 2015-09-13 MED ORDER — CHLORHEXIDINE GLUCONATE 0.12 % MT SOLN
15.0000 mL | Freq: Two times a day (BID) | OROMUCOSAL | Status: DC
Start: 2015-09-13 — End: 2015-09-14
  Administered 2015-09-13: 15 mL via OROMUCOSAL
  Filled 2015-09-13 (×3): qty 15

## 2015-09-13 MED ORDER — CETYLPYRIDINIUM CHLORIDE 0.05 % MT LIQD: 7.0000 mL | Freq: Two times a day (BID) | OROMUCOSAL | Status: DC

## 2015-09-13 MED ORDER — ZOLPIDEM TARTRATE 5 MG PO TABS
5.0000 mg | ORAL_TABLET | Freq: Every evening | ORAL | Status: DC | PRN
  Administered 2015-09-13: 5 mg via ORAL
  Filled 2015-09-13: qty 1

## 2015-09-13 MED ORDER — ONDANSETRON HCL 4 MG/2ML IJ SOLN
4.0000 mg | Freq: Four times a day (QID) | INTRAMUSCULAR | Status: DC | PRN
  Administered 2015-09-13 – 2015-09-14 (×3): 4 mg via INTRAVENOUS
  Filled 2015-09-13 (×3): qty 2

## 2015-09-13 NOTE — Consult Note (Signed)
GI Inpatient Consult Note  Reason for Consult: Hematemesis   Attending Requesting Consult:  History of Present Illness: Christy Mcbride is a 55 y.o. female with hx of DM, HTN who has been experiencing GI upset ever since being on a new DM medicine and who also takes ibuprofen typically tid for chronic leg pain, vomited large amount of blood last evening. Came to ER and vomited one more time. None since being admitted. Known hx of PUD 7 yrs ago where 6-7 ulcers were found. Has been on prevacid long term. Dose increased to bid recently due to increasing GI upset.  Past Medical History:  Past Medical History  Diagnosis Date  . Diabetes mellitus without complication (HCC)   . Thyroid disease   . Liver disease   . Herniated cervical disc   . Hypertension   . Allergy     Problem List: Patient Active Problem List   Diagnosis Date Noted  . GI bleed 09/13/2015  . DDD (degenerative disc disease), lumbar 03/19/2015  . Lumbar radiculopathy 03/19/2015  . Facet syndrome, lumbar 03/19/2015  . Bilateral occipital neuralgia 03/19/2015    Past Surgical History: Past Surgical History  Procedure Laterality Date  . Ablation    . Cholecystectomy    . Cesarean section    . Liver biopsy      Allergies: Allergies  Allergen Reactions  . Sulfa Antibiotics Anaphylaxis  . Fentanyl Itching  . Penicillins Itching  . Topiramate Itching    Hands and feet itching    Home Medications: Facility-administered medications prior to admission  Medication Dose Route Frequency Provider Last Rate Last Dose  . [DISCONTINUED] bupivacaine (PF) (MARCAINE) 0.25 % injection 30 mL  30 mL Other Once Ewing Schlein, MD       Prescriptions prior to admission  Medication Sig Dispense Refill Last Dose  . cyclobenzaprine (FLEXERIL) 10 MG tablet Take 1 tablet (10 mg total) by mouth every 8 (eight) hours as needed for muscle spasms. 30 tablet 1 PRN at PRN  . diazepam (VALIUM) 5 MG tablet Take 5 mg by mouth every 12 (twelve)  hours as needed for anxiety.   09/12/2015 at PRN  . ERGOCALCIFEROL PO Take 1.25 mg by mouth once a week.   Past Week at Unknown time  . furosemide (LASIX) 20 MG tablet Take 20 mg by mouth as needed.    PRN at PRN  . metFORMIN (GLUMETZA) 500 MG (MOD) 24 hr tablet Take 500 mg by mouth daily with breakfast.   09/11/2015 at unknown  . omeprazole (PRILOSEC) 20 MG capsule Take 20 mg by mouth 2 (two) times daily before a meal.   Past Week at Unknown time  . PARoxetine (PAXIL) 40 MG tablet Take 40 mg by mouth daily.   Past Week at Unknown time  . rOPINIRole (REQUIP) 1 MG tablet Take 1 mg by mouth at bedtime.   Past Week at Unknown time  . tiZANidine (ZANAFLEX) 4 MG capsule Take 1 capsule by mouth 4 (four) times daily as needed.   PRN at PRN  . metoCLOPramide (REGLAN) 10 MG tablet Take 1 tablet (10 mg total) by mouth 4 (four) times daily -  before meals and at bedtime. (Patient not taking: Reported on 09/13/2015) 60 tablet 0 Completed Course at Unknown time   Home medication reconciliation was completed with the patient.   Scheduled Inpatient Medications:   . antiseptic oral rinse  7 mL Mouth Rinse q12n4p  . chlorhexidine  15 mL Mouth Rinse BID  . insulin  aspart  0-9 Units Subcutaneous TID WC  . PARoxetine  40 mg Oral Daily  . rOPINIRole  1 mg Oral QHS    Continuous Inpatient Infusions:   . 0.9 % NaCl with KCl 20 mEq / L 100 mL/hr at 09/13/15 0333  . pantoprozole (PROTONIX) infusion 8 mg/hr (09/13/15 0400)    PRN Inpatient Medications:  acetaminophen **OR** acetaminophen, butalbital-acetaminophen-caffeine, cyclobenzaprine, diazepam, morphine injection, ondansetron **OR** ondansetron (ZOFRAN) IV, tiZANidine  Family History: family history includes Diabetes in her father; Heart disease in her father; Hypertension in her mother; Stroke in her mother.  The patient's family history is negative for inflammatory bowel disorders, GI malignancy, or solid organ transplantation.  Social History:   reports  that she has quit smoking. Her smoking use included Cigarettes. She does not have any smokeless tobacco history on file. She reports that she drinks alcohol. She reports that she does not use illicit drugs. The patient denies ETOH, tobacco, or drug use.   Review of Systems: Constitutional: Weight is stable.  Eyes: No changes in vision. ENT: No oral lesions, sore throat.  GI: see HPI.  Heme/Lymph: No easy bruising.  CV: No chest pain.  GU: No hematuria.  Integumentary: No rashes.  Neuro: No headaches.  Psych: No depression/anxiety.  Endocrine: No heat/cold intolerance.  Allergic/Immunologic: No urticaria.  Resp: No cough, SOB.  Musculoskeletal: No joint swelling.    Physical Examination: BP 106/67 mmHg  Pulse 106  Temp(Src) 97.7 F (36.5 C) (Oral)  Resp 18  Ht 5\' 2"  (1.575 m)  Wt 68.04 kg (150 lb)  BMI 27.43 kg/m2  SpO2 76% Gen: NAD, alert and oriented x 4 HEENT: PEERLA, EOMI, Neck: supple, no JVD or thyromegaly Chest: CTA bilaterally, no wheezes, crackles, or other adventitious sounds CV: RRR, no m/g/c/r Abd: soft,mild epigastric tenderness, ND, +BS in all four quadrants; no HSM, guarding, ridigity, or rebound tenderness Ext: no edema, well perfused with 2+ pulses, Skin: no rash or lesions noted Lymph: no LAD  Data: Lab Results  Component Value Date   WBC 9.9 09/13/2015   HGB 12.5 09/13/2015   HCT 38.5 09/13/2015   MCV 82.0 09/13/2015   PLT 300 09/13/2015    Recent Labs Lab 09/12/15 2124 09/13/15 0358  HGB 13.6 12.5   Lab Results  Component Value Date   NA 140 09/12/2015   K 3.0* 09/12/2015   CL 105 09/12/2015   CO2 25 09/12/2015   BUN 9 09/12/2015   CREATININE 0.46 09/12/2015   Lab Results  Component Value Date   ALT 50 09/12/2015   AST 69* 09/12/2015   ALKPHOS 87 09/12/2015   BILITOT 0.7 09/12/2015   No results for input(s): APTT, INR, PTT in the last 168 hours. Assessment/Plan: Ms. Christy Mcbride is a 55 y.o. female with UGI bleeding, exacerbated by  chronic ibuprofen use.   Recommendations: EGD tomorrow AM. Pt already had breakfast this AM. PPI BID. Moniter hgb. Make sure K stays above 3.0. Thank you for the consult. Please call with questions or concerns.  Kindel Rochefort, Ezzard StandingPAUL Y, MD

## 2015-09-13 NOTE — Progress Notes (Signed)
[facility] [place] [facility]   PATIENT NAME: Christy Mcbride    MRN#:  [id number]  DATE OF BIRTH:  03-18-1961  SUBJECTIVE:  Hospital Day: 0 days Christy Mcbride is a 55 y.o. female presenting with Hematemesis and Abdominal Pain .   Overnight events: No overnight events Interval Events: Still complains of some nauseousness, abdominal pain improved episodes of vomiting  REVIEW OF SYSTEMS:  CONSTITUTIONAL: No fever, fatigue or weakness.  EYES: No blurred or double vision.  EARS, NOSE, AND THROAT: No tinnitus or ear pain.  RESPIRATORY: No cough, shortness of breath, wheezing or hemoptysis.  CARDIOVASCULAR: No chest pain, orthopnea, edema.  GASTROINTESTINAL: Positive nausea, vomiting, r abdominal pain.  GENITOURINARY: No dysuria, hematuria.  ENDOCRINE: No polyuria, nocturia,  HEMATOLOGY: No anemia, easy bruising or bleeding SKIN: No rash or lesion. MUSCULOSKELETAL: No joint pain or arthritis.   NEUROLOGIC: No tingling, numbness, weakness.  PSYCHIATRY: No anxiety or depression.   DRUG ALLERGIES:   Allergies  Allergen Reactions  . Sulfa Antibiotics Anaphylaxis  . Fentanyl Itching  . Penicillins Itching  . Topiramate Itching    Hands and feet itching    VITALS:  Blood pressure 106/67, pulse 106, temperature 97.7 F (36.5 C), temperature source Oral, resp. rate 18, height  (1.575 m), weight 68.04 kg (150 lb), SpO2 76 %.  PHYSICAL EXAMINATION:  VITAL SIGNS: Filed Vitals:   09/13/15 0216 09/13/15 0535  BP: 127/71 106/67  Pulse: 100 106  Temp: 97.8 F (36.6 C) 97.7 F (36.5 C)  Resp: 20 18   GENERAL:55 y.o.female currently in no acute distress.  HEAD: Normocephalic, atraumatic.  EYES: Pupils equal, round, reactive to light. Extraocular muscles intact. No scleral icterus.  MOUTH: Moist mucosal membrane. Dentition intact. No abscess noted.  EAR, NOSE, THROAT: Clear without exudates. No external lesions.  NECK: Supple. No thyromegaly. No  nodules. No JVD.  PULMONARY: Clear to ascultation, without wheeze rails or rhonci. No use of accessory muscles, Good respiratory effort. good air entry bilaterally CHEST: Nontender to palpation.  CARDIOVASCULAR: S1 and S2. Regular rate and rhythm. No murmurs, rubs, or gallops. No edema. Pedal pulses 2+ bilaterally.  GASTROINTESTINAL: Soft, nontender, nondistended. No masses. Positive bowel sounds. No hepatosplenomegaly.  MUSCULOSKELETAL: No swelling, clubbing, or edema. Range of motion full in all extremities.  NEUROLOGIC: Cranial nerves II through XII are intact. No gross focal neurological deficits. Sensation intact. Reflexes intact.  SKIN: No ulceration, lesions, rashes, or cyanosis. Skin warm and dry. Turgor intact.  PSYCHIATRIC: Mood, affect within normal limits. The patient is awake, alert and oriented x 3. Insight, judgment intact.      LABORATORY PANEL:   CBC  Recent Labs Lab 09/13/15 0358  WBC 9.9  HGB 12.5  HCT 38.5  PLT 300   ------------------------------------------------------------------------------------------------------------------  Chemistries   Recent Labs Lab 09/12/15 2124  NA 140  K 3.0*  CL 105  CO2 25  GLUCOSE 113*  BUN 9  CREATININE 0.46  CALCIUM 9.4  AST 69*  ALT 50  ALKPHOS 87  BILITOT 0.7   ------------------------------------------------------------------------------------------------------------------  Cardiac Enzymes No results for input(s): TROPONINI in the last 168 hours. ------------------------------------------------------------------------------------------------------------------  RADIOLOGY:  Dg Abd Acute W/chest  09/13/2015  CLINICAL DATA:  Subacute onset of generalized abdominal pain and swelling. Hematemesis. Initial encounter. EXAM: DG ABDOMEN ACUTE W/ 1V CHEST COMPARISON:  CT of the abdomen and pelvis from 09/03/2015, and chest radiograph performed 10/10/2014 FINDINGS: The lungs are well-aerated. Mild peribronchial  thickening is noted. There is no  evidence of focal opacification, pleural effusion or pneumothorax. The cardiomediastinal silhouette is borderline normal in size. The visualized bowel gas pattern is unremarkable. Scattered stool and air are seen within the colon; there is no evidence of small bowel dilatation to suggest obstruction. No free intra-abdominal air is identified on the provided upright view. Clips are noted within the right upper quadrant, reflecting prior cholecystectomy. No acute osseous abnormalities are seen; the sacroiliac joints are unremarkable in appearance. IMPRESSION: 1. Unremarkable bowel gas pattern; no free intra-abdominal air seen. Moderate amount of stool noted in the colon. 2. Mild peribronchial thickening noted.  Lungs otherwise clear. Electronically Signed   By: Roanna RaiderJeffery  Chang M.D.   On: 09/13/2015 01:20    EKG:   Orders placed or performed during the hospital encounter of 10/10/14  . EKG 12-Lead  . EKG 12-Lead  . ED EKG  . ED EKG  . EKG    ASSESSMENT AND PLAN:   Christy Mcbride is a 55 y.o. female presenting with Hematemesis and Abdominal Pain . Admitted 09/12/2015 : Day #: 0 days  1. Upper GI bleed-change diet nothing by mouth, follow CBC, change Protonix to twice a day, endoscopy in the morning  2. Type 2 diabetes non-insulin-requiring, hold oral agents Keep her on sliding scale insulin  3.N ASH -Mildly elevated transaminase. Continue to monitor  4. Hypokalemia replete with IV fluids, recheck potassium goal 4-5 All the records are reviewed and case discussed with Care Management/Social Workerr. Management plans discussed with the patient, family and they are in agreement.  CODE STATUS: full TOTAL TIME TAKING CARE OF THIS PATIENT: 28 minutes.   POSSIBLE D/C IN 1-2DAYS, DEPENDING ON CLINICAL CONDITION.   Hower,  Mardi MainlandDavid K M.D on 09/13/2015 at 12:06 PM  Between 7am to 6pm - Pager - [phone]  After 6pm: House Pager: - [phone]  [facility]  Office  [phone]  CC: Primary care physician; Leanna SatoMILES,LINDA M, MD

## 2015-09-13 NOTE — Progress Notes (Signed)
Initial Nutrition Assessment     INTERVENTION:  -Monitor intake and diet progression   NUTRITION DIAGNOSIS:   Inadequate oral intake related to altered GI function as evidenced by meal completion < 25%.    GOAL:   Patient will meet greater than or equal to 90% of their needs    MONITOR:   PO intake, Diet advancement  REASON FOR ASSESSMENT:   Malnutrition Screening Tool    ASSESSMENT:   55 y/o female admitted with upper GI bleed Past Medical History  Diagnosis Date  . Diabetes mellitus without complication (HCC)   . Thyroid disease   . Liver disease   . Herniated cervical disc   . Hypertension   . Allergy     Pt reports decreased intake for the past 3 weeks, eating mostly broth, no solid foods secondary to abdominal issues and nausea. Reports did drink broth for lunch today  Medications reviewed: aspart, protonix, NS with KCL at 15000ml/hr  Labs reviewed: FSBS 104, 151, 134, K 3.0  Nutrition-Focused physical exam completed. Findings are WDL for fat depletion, muscle depletion, and edema.     Diet Order:  Diet clear liquid Room service appropriate?: Yes; Fluid consistency:: Thin Diet NPO time specified  Skin:  Reviewed, no issues  Last BM:  09/08/2015  Height:   Ht Readings from Last 1 Encounters:  09/12/15 5\' 2"  (1.575 m)    Weight: 3% wt loss in the last 3 months per wt encounter Pt reports 10 pound wt loss in the last 3 weeks. Wt Readings from Last 1 Encounters:  09/12/15 150 lb (68.04 kg)   Wt Readings from Last 10 Encounters:  09/12/15 150 lb (68.04 kg)  07/28/15 150 lb (68.04 kg)  06/15/15 155 lb (70.308 kg)  05/28/15 155 lb (70.308 kg)  05/22/15 155 lb (70.308 kg)  04/29/15 150 lb (68.04 kg)  04/08/15 160 lb (72.576 kg)  03/19/15 160 lb (72.576 kg)  03/03/15 150 lb (68.04 kg)  02/19/15 150 lb (68.04 kg)    Ideal Body Weight:     BMI:  Body mass index is 27.43 kg/(m^2).  Estimated Nutritional Needs:   Kcal:  1700-2040  kcals/d  Protein:  68-82 g/d  Fluid:  1.7-2 L/d  EDUCATION NEEDS:   No education needs identified at this time  Quetzali Heinle B. Freida BusmanAllen, RD, LDN [phone] (pager) Weekend/On-Call pager [phone])

## 2015-09-13 NOTE — Evaluation (Signed)
Physical Therapy Evaluation Patient Details Name: Christy HeraldDiane Zeigler MRN: [id number] DOB: 05-15-1961 Today's Date: 09/13/2015   History of Present Illness  Pt here with GI bleed, has had some adbominal pain 1-2 months.  She has had multiple falls recently.  Pt has DDD with nerve issues most notedly in L LE with occasional buckling and constant numbness.   Clinical Impression  Pt is able to ambulate w/o AD and though she is slow and guarded she has no LOBs or safety issues.  She is able to negotiate up/down 5 steps with single rail and generally does well.  She reports that she is still weak and that her L LE is buckling more and more.  Discussed that she may need to consider a cane or other AD if this continues to be an issues, but at this point she is safe to go home.     Follow Up Recommendations No PT follow up    Equipment Recommendations       Recommendations for Other Services       Precautions / Restrictions Precautions Precautions: Fall Restrictions Weight Bearing Restrictions: No      Mobility  Bed Mobility Overal bed mobility: Independent             General bed mobility comments: Pt able to get up to EOB w/o issue  Transfers Overall transfer level: Independent Equipment used: None             General transfer comment: Pt shows good confidence in getting to standing, she has no balance/safety issues.   Ambulation/Gait Ambulation/Gait assistance: Supervision Ambulation Distance (Feet): 250 Feet Assistive device: None       General Gait Details: Pt is able to ambulate with consistent, safe ambulation.  She does not have any L knee buckling (though she does report that this happens a lot) and generally is able to maintain consistent (though slow) speed.    Stairs Stairs: Yes Stairs assistance: Supervision Stair Management: One rail Left Number of Stairs: 5 General stair comments: Pt is able to negotiate up/down steps using step-to gait and no safety  issues  Wheelchair Mobility    Modified Rankin (Stroke Patients Only)       Balance                                             Pertinent Vitals/Pain Pain Assessment: 0-10 Pain Score: 6  Pain Location: L LE     Home Living Family/patient expects to be discharged to:: Private residence Living Arrangements: Alone Available Help at Discharge: Family (son can assist)   Home Access: Level entry     Home Layout: Two level Home Equipment: None      Prior Function Level of Independence: Independent         Comments: Pt does not use a cane or walker.  She is normally able to run all her errands, etc but has been weak recently.      Hand Dominance        Extremity/Trunk Assessment   Upper Extremity Assessment: Generalized weakness (pt lacks b/l overhead shoulder elevation)           Lower Extremity Assessment: LLE deficits/detail   LLE Deficits / Details: R LE grossly 4/5, L LE grossly 3+/5 with numbness t/o and low level pain     Communication      Cognition  Arousal/Alertness: Awake/alert Behavior During Therapy: WFL for tasks assessed/performed Overall Cognitive Status: Within Functional Limits for tasks assessed                      General Comments      Exercises        Assessment/Plan    PT Assessment Patent does not need any further PT services  PT Diagnosis Difficulty walking;Generalized weakness   PT Problem List    PT Treatment Interventions     PT Goals (Current goals can be found in the Care Plan section) Acute Rehab PT Goals Patient Stated Goal: Go home PT Goal Formulation: With patient Time For Goal Achievement: 09/27/15 Potential to Achieve Goals: Good    Frequency     Barriers to discharge        Co-evaluation               End of Session Equipment Utilized During Treatment: Gait belt Activity Tolerance: Patient limited by fatigue Patient left: with bed alarm set;with call bell/phone  within reach           Time: 1152-1210 PT Time Calculation (min) (ACUTE ONLY): 18 min   Charges:   PT Evaluation $PT Eval Low Complexity: 1 Procedure     PT G Codes:       Loran Senters, PT, DPT [phone]  Malachi Pro 09/13/2015, 2:58 PM

## 2015-09-13 NOTE — H&P (Signed)
[facility] [place] [facility]   PATIENT NAME: Christy Mcbride    MR#:  [id number]  DATE OF BIRTH:  Apr 04, 1961  DATE OF ADMISSION:  09/12/2015  PRIMARY CARE PHYSICIAN: Leanna Sato, MD   REQUESTING/REFERRING PHYSICIAN: dr Thomasene Mohair  CHIEF COMPLAINT:  vomitng dark blood  HISTORY OF PRESENT ILLNESS:  Christy Mcbride  is a 55 y.o. female with a past medical history of diabetes, hypertension, liver disease, presents the emergency department with abdominal pain and nausea and vomiting as well as hematemesis. According to the patient for the past 1-2 months she has had intermittent diffuse abdominal pain, and vomiting after meals. States a history of peptic ulcer disease. States for the past 2-3 weeks she has been vomiting after all meals, and is having trouble keeping any food down. Patient reports taking ibuprofen about 2 weeks ago she was taking about 800 mg by mouth once to 3 times a day for her foot pain. She also has been having some dark-colored stools. No  bloody stools. Physical PAST MEDICAL HISTORY:   Past Medical History  Diagnosis Date  . Diabetes mellitus without complication (HCC)   . Thyroid disease   . Liver disease   . Herniated cervical disc   . Hypertension   . Allergy     PAST SURGICAL HISTOIRY:   Past Surgical History  Procedure Laterality Date  . Ablation    . Cholecystectomy    . Cesarean section    . Liver biopsy      SOCIAL HISTORY:   Social History  Substance Use Topics  . Smoking status: Former Smoker    Types: Cigarettes  . Smokeless tobacco: Not on file  . Alcohol Use: Yes     Comment: occas.    FAMILY HISTORY:   Family History  Problem Relation Age of Onset  . Hypertension Mother   . Stroke Mother   . Diabetes Father   . Heart disease Father     DRUG ALLERGIES:   Allergies  Allergen Reactions  . Sulfa Antibiotics Anaphylaxis  . Fentanyl Itching  . Penicillins Itching  . Topiramate Itching   Hands and feet itching    REVIEW OF SYSTEMS:  Review of Systems  Constitutional: Negative for fever, chills and weight loss.  HENT: Negative for ear discharge, ear pain and nosebleeds.   Eyes: Negative for blurred vision, pain and discharge.  Respiratory: Negative for sputum production, shortness of breath, wheezing and stridor.   Cardiovascular: Negative for chest pain, palpitations, orthopnea and PND.  Gastrointestinal: Positive for heartburn, vomiting, abdominal pain and melena. Negative for nausea and diarrhea.  Genitourinary: Negative for urgency and frequency.  Musculoskeletal: Negative for back pain and joint pain.  Neurological: Negative for sensory change, speech change, focal weakness and weakness.  Psychiatric/Behavioral: Negative for depression and hallucinations. The patient is not nervous/anxious.   All other systems reviewed and are negative.    MEDICATIONS AT HOME:   Prior to Admission medications   Medication Sig Start Date End Date Taking? Authorizing Provider  cyclobenzaprine (FLEXERIL) 10 MG tablet Take 1 tablet (10 mg total) by mouth every 8 (eight) hours as needed for muscle spasms. 07/28/15   Charmayne Sheer Beers, PA-C  ERGOCALCIFEROL PO Take 1.25 mg by mouth once a week.    Historical Provider, MD  Estradiol-Norethindrone Acet 0.5-0.1 MG per tablet Take 1 tablet by mouth daily. Reported on 05/28/2015    Historical Provider, MD  furosemide (LASIX) 20 MG tablet Take 20 mg by mouth  as needed.     Historical Provider, MD  ibuprofen (ADVIL,MOTRIN) 800 MG tablet Take 1 tablet (800 mg total) by mouth every 8 (eight) hours as needed. 07/28/15   Charmayne Sheerharles M Beers, PA-C  metFORMIN (GLUMETZA) 500 MG (MOD) 24 hr tablet Take 500 mg by mouth daily with breakfast.    Historical Provider, MD  metoCLOPramide (REGLAN) 10 MG tablet Take 1 tablet (10 mg total) by mouth 4 (four) times daily -  before meals and at bedtime. 02/11/15   Sharman CheekPhillip Stafford, MD  omeprazole (PRILOSEC) 20 MG capsule Take  20 mg by mouth 2 (two) times daily before a meal.    Historical Provider, MD  oxyCODONE-acetaminophen (ROXICET) 5-325 MG tablet Take 1-2 tablets by mouth every 4 (four) hours as needed for severe pain. 07/28/15   Charmayne Sheerharles M Beers, PA-C  PARoxetine (PAXIL) 40 MG tablet Take 40 mg by mouth daily.    Historical Provider, MD      VITAL SIGNS:  Blood pressure 161/83, pulse 78, temperature 98.1 F (36.7 C), temperature source Oral, resp. rate 20, height 5\' 2"  (1.575 m), weight 68.04 kg (150 lb), SpO2 96 %.  PHYSICAL EXAMINATION:  GENERAL:  55 y.o.-year-old patient lying in the bed with no acute distress.  EYES: Pupils equal, round, reactive to light and accommodation. No scleral icterus. Extraocular muscles intact.  HEENT: Head atraumatic, normocephalic. Oropharynx and nasopharynx clear.  NECK:  Supple, no jugular venous distention. No thyroid enlargement, no tenderness.  LUNGS: Normal breath sounds bilaterally, no wheezing, rales,rhonchi or crepitation. No use of accessory muscles of respiration.  CARDIOVASCULAR: S1, S2 normal. No murmurs, rubs, or gallops.  ABDOMEN: Soft, nontender, nondistended. Bowel sounds present. No organomegaly or mass.  EXTREMITIES: No pedal edema, cyanosis, or clubbing.  NEUROLOGIC: Cranial nerves II through XII are intact. Muscle strength 5/5 in all extremities. Sensation intact. Gait not checked.  PSYCHIATRIC: The patient is alert and oriented x 3.  SKIN: No obvious rash, lesion, or ulcer.   LABORATORY PANEL:   CBC  Recent Labs Lab 09/12/15 2124  WBC 8.8  HGB 13.6  HCT 41.5  PLT 306   ------------------------------------------------------------------------------------------------------------------  Chemistries   Recent Labs Lab 09/12/15 2124  NA 140  K 3.0*  CL 105  CO2 25  GLUCOSE 113*  BUN 9  CREATININE 0.46  CALCIUM 9.4  AST 69*  ALT 50  ALKPHOS 87  BILITOT 0.7   IMPRESSION AND PLAN:   Christy HeraldDiane Mcbride is a 55 y.o. female with a past  medical history of diabetes, hypertension, liver disease, presents the emergency department with abdominal pain and nausea and vomiting as well as hematemesis. According to the patient for the past 1-2 months she has had intermittent diffuse abdominal pain, and vomiting after meals. States a history of peptic ulcer disease. States for the past 2-3 weeks she has been vomiting after all meals, and is having trouble keeping any food down  1. GI bleed/coffee-ground/hematemesis suspected due to peptic ulcer disease in the setting of NSAID use -Admit to medical floor -Clear liquid diet -H&H every 12 hourly. Transfuse as needed -Continue IV Protonix drip -GI consultation -Patient advised to avoid NSAIDs/aspirin  2. Type 2 diabetes Keep her on sliding scale insulin  3.N ASH -Mildly elevated transaminase. Continue to monitor  4. Hypokalemia replete with IV fluids  5. DVT prophylaxis SCD and teds. Avoid antiplatelet secondary to GI bleed  All the records are reviewed and case discussed with ED provider. Management plans discussed with the patient, family and  they are in agreement.  CODE STATUS: Full  TOTAL TIME TAKING CARE OF THIS PATIENT: 45 minutes.    Ki Luckman M.D on 09/13/2015 at 12:47 AM  Between 7am to 6pm - Pager - [phone]  After 6pm go to [email or web address] - password [email or web address] [facility]  [facility]  Office  [phone]  CC: Primary care physician; Leanna Sato, MD

## 2015-09-14 ENCOUNTER — Encounter
Admission: EM | Disposition: A | Discharge status: Home / Self Care | Payer: Self-pay | Source: Home / Self Care | Attending: Internal Medicine

## 2015-09-14 ENCOUNTER — Encounter: Payer: Self-pay | Admitting: *Deleted

## 2015-09-14 ENCOUNTER — Inpatient Hospital Stay: Payer: BLUE CROSS/BLUE SHIELD | Admitting: Anesthesiology

## 2015-09-14 HISTORY — PX: ESOPHAGOGASTRODUODENOSCOPY (EGD) WITH PROPOFOL: SHX5813

## 2015-09-14 LAB — BASIC METABOLIC PANEL
Anion gap: 6 (ref 5–15)
CHLORIDE: 103 mmol/L (ref 101–111)
CO2: 28 mmol/L (ref 22–32)
CREATININE: 0.39 mg/dL — AB (ref 0.44–1.00)
Calcium: 8.1 mg/dL — ABNORMAL LOW (ref 8.9–10.3)
GFR calc Af Amer: >60 mL/min (ref >60)
GFR calc non Af Amer: >60 mL/min (ref >60)
Glucose, Bld: 105 mg/dL — ABNORMAL HIGH (ref 65–99)
POTASSIUM: 3.2 mmol/L — AB (ref 3.5–5.1)
Sodium: 137 mmol/L (ref 135–145)

## 2015-09-14 LAB — CBC
HEMATOCRIT: 38.2 % (ref 35.0–47.0)
Hemoglobin: 12.5 g/dL (ref 12.0–16.0)
MCH: 26.8 pg (ref 26.0–34.0)
MCHC: 32.8 g/dL (ref 32.0–36.0)
MCV: 81.5 fL (ref 80.0–100.0)
PLATELETS: 236 10*3/uL (ref 150–440)
RBC: 4.69 MIL/uL (ref 3.80–5.20)
RDW: 13.8 % (ref 11.5–14.5)
WBC: 4.8 10*3/uL (ref 3.6–11.0)

## 2015-09-14 LAB — GLUCOSE, CAPILLARY
GLUCOSE-CAPILLARY: 109 mg/dL — AB (ref 65–99)
Glucose-Capillary: 98 mg/dL (ref 65–99)
Glucose-Capillary: 139 mg/dL — ABNORMAL HIGH (ref 65–99)

## 2015-09-14 SURGERY — ESOPHAGOGASTRODUODENOSCOPY (EGD) WITH PROPOFOL
Anesthesia: General

## 2015-09-14 MED ORDER — PROPOFOL 500 MG/50ML IV EMUL
INTRAVENOUS | Status: DC | PRN
  Administered 2015-09-14: 150 ug/kg/min via INTRAVENOUS

## 2015-09-14 MED ORDER — PROPOFOL 10 MG/ML IV BOLUS
INTRAVENOUS | Status: DC | PRN
  Administered 2015-09-14: 50 mg via INTRAVENOUS

## 2015-09-14 MED ORDER — LIDOCAINE HCL (CARDIAC) 20 MG/ML IV SOLN
INTRAVENOUS | Status: DC | PRN
  Administered 2015-09-14: 50 mg via INTRATRACHEAL

## 2015-09-14 MED ORDER — PANTOPRAZOLE SODIUM 40 MG PO TBEC: 40.0000 mg | DELAYED_RELEASE_TABLET | Freq: Two times a day (BID) | ORAL | Status: AC

## 2015-09-14 MED ORDER — SIMETHICONE 80 MG PO CHEW: 80.0000 mg | CHEWABLE_TABLET | Freq: Four times a day (QID) | ORAL | Status: AC | PRN

## 2015-09-14 NOTE — Anesthesia Preprocedure Evaluation (Addendum)
Anesthesia Evaluation  Patient identified by MRN, date of birth, ID band Patient awake    Reviewed: Allergy & Precautions, H&P , NPO status , Patient's Chart, lab work & pertinent test results  History of Anesthesia Complications Negative for: history of anesthetic complications  Airway Mallampati: III  TM Distance: >3 FB Neck ROM: full    Dental  (+) Poor Dentition, Chipped   Pulmonary neg shortness of breath, sleep apnea , former smoker,    Pulmonary exam normal breath sounds clear to auscultation       Cardiovascular Exercise Tolerance: Good hypertension, (-) angina(-) Past MI and (-) DOE Normal cardiovascular exam Rhythm:regular Rate:Normal     Neuro/Psych  Neuromuscular disease negative psych ROS   GI/Hepatic negative GI ROS, Neg liver ROS,   Endo/Other  diabetes, Type 2  Renal/GU negative Renal ROS  negative genitourinary   Musculoskeletal  (+) Arthritis ,   Abdominal   Peds  Hematology negative hematology ROS (+)   Anesthesia Other Findings Past Medical History:   Diabetes mellitus without complication (HCC)                 Thyroid disease                                              Liver disease                                                Herniated cervical disc                                      Hypertension                                                 Allergy                                                      Sleep apnea                                                 Past Surgical History:   ABLATION                                                      CHOLECYSTECTOMY                                               CESAREAN SECTION  LIVER BIOPSY                                                 BMI    Body Mass Index   27.42 kg/m 2    Patient is NPO appropriate and reports no nausea or vomiting today.  Reproductive/Obstetrics negative OB  ROS                            Anesthesia Physical Anesthesia Plan  ASA: III  Anesthesia Plan: General   Post-op Pain Management:    Induction:   Airway Management Planned:   Additional Equipment:   Intra-op Plan:   Post-operative Plan:   Informed Consent: I have reviewed the patients History and Physical, chart, labs and discussed the procedure including the risks, benefits and alternatives for the proposed anesthesia with the patient or authorized representative who has indicated his/her understanding and acceptance.   Dental Advisory Given  Plan Discussed with: Anesthesiologist, CRNA and Surgeon  Anesthesia Plan Comments:         Anesthesia Quick Evaluation

## 2015-09-14 NOTE — Discharge Instructions (Signed)
Gastrointestinal Bleeding Gastrointestinal (GI) bleeding means there is bleeding somewhere along the digestive tract, between the mouth and anus. CAUSES  There are many different problems that can cause GI bleeding. Possible causes include:  Esophagitis. This is inflammation, irritation, or swelling of the esophagus.  Hemorrhoids.These are veins that are full of blood (engorged) in the rectum. They cause pain, inflammation, and may bleed.  Anal fissures.These are areas of painful tearing which may bleed. They are often caused by passing hard stool.  Diverticulosis.These are pouches that form on the colon over time, with age, and may bleed significantly.  Diverticulitis.This is inflammation in areas with diverticulosis. It can cause pain, fever, and bloody stools, although bleeding is rare.  Polyps and cancer. Colon cancer often starts out as precancerous polyps.  Gastritis and ulcers.Bleeding from the upper gastrointestinal tract (near the stomach) may travel through the intestines and produce black, sometimes tarry, often bad smelling stools. In certain cases, if the bleeding is fast enough, the stools may not be black, but red. This condition may be life-threatening. SYMPTOMS   Vomiting bright red blood or material that looks like coffee grounds.  Bloody, black, or tarry stools. DIAGNOSIS  Your caregiver may diagnose your condition by taking your history and performing a physical exam. More tests may be needed, including:  X-rays and other imaging tests.  Esophagogastroduodenoscopy (EGD). This test uses a flexible, lighted tube to look at your esophagus, stomach, and small intestine.  Colonoscopy. This test uses a flexible, lighted tube to look at your colon. TREATMENT  Treatment depends on the cause of your bleeding.   For bleeding from the esophagus, stomach, small intestine, or colon, the caregiver doing your EGD or colonoscopy may be able to stop the bleeding as part of  the procedure.  Inflammation or infection of the colon can be treated with medicines.  Many rectal problems can be treated with creams, suppositories, or warm baths.  Surgery is sometimes needed.  Blood transfusions are sometimes needed if you have lost a lot of blood. If bleeding is slow, you may be allowed to go home. If there is a lot of bleeding, you will need to stay in the hospital for observation. HOME CARE INSTRUCTIONS   Take any medicines exactly as prescribed.  Keep your stools soft by eating foods that are high in fiber. These foods include whole grains, legumes, fruits, and vegetables. Prunes (1 to 3 a day) work well for many people.  Drink enough fluids to keep your urine clear or pale yellow. SEEK IMMEDIATE MEDICAL CARE IF:   Your bleeding increases.  You feel lightheaded, weak, or you faint.  You have severe cramps in your back or abdomen.  You pass large blood clots in your stool.  Your problems are getting worse. MAKE SURE YOU:   Understand these instructions.  Will watch your condition.  Will get help right away if you are not doing well or get worse.   This information is not intended to replace advice given to you by your health care provider. Make sure you discuss any questions you have with your health care provider.   Document Released: 05/15/2000 Document Revised: 05/04/2012 Document Reviewed: 11/05/2014 Elsevier Interactive Patient Education 2016 [employer]   Follow all MD discharge instructions. Take all medications as prescribed. Keep all follow up appointments. If your symptoms return, call your doctor. If you experience any new symptoms that are of concern to you or that are bothersome to you, call your doctor. For all  questions and/or concerns, call your doctor.   If you have a medical emergency, call 911

## 2015-09-14 NOTE — Progress Notes (Signed)
Pt d/c home; d/c instructions reviewed w/ pt; pt understanding was verbalized; IV removed catheter in tact, gauze dressing applied; all pt questions answered; pt left unit via wheelchair accompanied by staff 

## 2015-09-14 NOTE — Anesthesia Postprocedure Evaluation (Signed)
Anesthesia Post Note  Patient: Christy HeraldDiane Revere  Procedure(s) Performed: Procedure(s) (LRB): ESOPHAGOGASTRODUODENOSCOPY (EGD) WITH PROPOFOL (N/A)  Patient location during evaluation: Endoscopy Anesthesia Type: General Level of consciousness: awake and alert Pain management: pain level controlled Vital Signs Assessment: post-procedure vital signs reviewed and stable Respiratory status: spontaneous breathing, nonlabored ventilation, respiratory function stable and patient connected to nasal cannula oxygen Cardiovascular status: blood pressure returned to baseline and stable Postop Assessment: no signs of nausea or vomiting Anesthetic complications: no    Last Vitals:  Filed Vitals:   09/14/15 0832 09/14/15 0845  BP: 165/93 159/89  Pulse: 90 86  Temp:    Resp: 18 16    Last Pain:  Filed Vitals:   09/14/15 0847  PainSc: 4                  Cleda MccreedyJoseph K Suhaan Perleberg

## 2015-09-14 NOTE — Discharge Summary (Signed)
[employer] - [place] [facility]   PATIENT NAME: Christy Mcbride    MR#:  [id number]  DATE OF BIRTH:  1960/09/17  DATE OF ADMISSION:  09/12/2015 ADMITTING PHYSICIAN: Enedina Finner, MD  DATE OF DISCHARGE: 09/14/2015  PRIMARY CARE PHYSICIAN: Leanna Sato, MD    ADMISSION DIAGNOSIS:  Gastrointestinal hemorrhage, unspecified gastritis, unspecified gastrointestinal hemorrhage type [K92.2]  DISCHARGE DIAGNOSIS:  Active Problems:   GI bleed upper secondary to gastric polyp and gastritis  SECONDARY DIAGNOSIS:   Past Medical History  Diagnosis Date  . Diabetes mellitus without complication (HCC)   . Thyroid disease   . Liver disease   . Herniated cervical disc   . Hypertension   . Allergy   . Sleep apnea     HOSPITAL COURSE:  Christy Mcbride  is a 55 y.o. female admitted 09/12/2015 with chief complaint Hematemesis and Abdominal Pain . Please see H&P performed by Enedina Finner, MD for further information. On admission started on IV PPI therapy, GI consulted. She had no further episodes of bleeding while in the hospital. EGD performed revealing - A single gastric polyp. Biopsied, Erythematous mucosa in the gastric body. Patient feels improvement prior to discharge  DISCHARGE CONDITIONS:   stable  CONSULTS OBTAINED:     DRUG ALLERGIES:   Allergies  Allergen Reactions  . Sulfa Antibiotics Anaphylaxis  . Fentanyl Itching  . Penicillins Itching  . Topiramate Itching    Hands and feet itching    DISCHARGE MEDICATIONS:   Current Discharge Medication List    START taking these medications   Details  pantoprazole (PROTONIX) 40 MG tablet Take 1 tablet (40 mg total) by mouth 2 (two) times daily. Qty: 60 tablet, Refills: 0    simethicone (GAS-X) 80 MG chewable tablet Chew 1 tablet (80 mg total) by mouth every 6 (six) hours as needed for flatulence. Qty: 30 tablet, Refills: 0      CONTINUE these medications which have NOT CHANGED   Details  cyclobenzaprine  (FLEXERIL) 10 MG tablet Take 1 tablet (10 mg total) by mouth every 8 (eight) hours as needed for muscle spasms. Qty: 30 tablet, Refills: 1    diazepam (VALIUM) 5 MG tablet Take 5 mg by mouth every 12 (twelve) hours as needed for anxiety.    ERGOCALCIFEROL PO Take 1.25 mg by mouth once a week.    furosemide (LASIX) 20 MG tablet Take 20 mg by mouth as needed.     metFORMIN (GLUMETZA) 500 MG (MOD) 24 hr tablet Take 500 mg by mouth daily with breakfast.    PARoxetine (PAXIL) 40 MG tablet Take 40 mg by mouth daily.    rOPINIRole (REQUIP) 1 MG tablet Take 1 mg by mouth at bedtime.    tiZANidine (ZANAFLEX) 4 MG capsule Take 1 capsule by mouth 4 (four) times daily as needed.      STOP taking these medications     omeprazole (PRILOSEC) 20 MG capsule      metoCLOPramide (REGLAN) 10 MG tablet          DISCHARGE INSTRUCTIONS:    DIET:  Regular diet  DISCHARGE CONDITION:  Good  ACTIVITY:  Activity as tolerated  OXYGEN:  Home Oxygen: No.   Oxygen Delivery: room air  DISCHARGE LOCATION:  home   If you experience worsening of your admission symptoms, develop shortness of breath, life threatening emergency, suicidal or homicidal thoughts you must seek medical attention immediately by calling 911 or calling your MD immediately  if symptoms less severe.  You Must read complete instructions/literature along with all the possible adverse reactions/side effects for all the Medicines you take and that have been prescribed to you. Take any new Medicines after you have completely understood and accpet all the possible adverse reactions/side effects.   Please note  You were cared for by a hospitalist during your hospital stay. If you have any questions about your discharge medications or the care you received while you were in the hospital after you are discharged, you can call the unit and asked to speak with the hospitalist on call if the hospitalist that took care of you is not  available. Once you are discharged, your primary care physician will handle any further medical issues. Please note that NO REFILLS for any discharge medications will be authorized once you are discharged, as it is imperative that you return to your primary care physician (or establish a relationship with a primary care physician if you do not have one) for your aftercare needs so that they can reassess your need for medications and monitor your lab values.    On the day of Discharge:   VITAL SIGNS:  Blood pressure 169/72, pulse 92, temperature 98.5 F (36.9 C), temperature source Oral, resp. rate 16, height 5\' 2"  (1.575 m), weight 68.04 kg (150 lb), SpO2 94 %.  I/O:   Intake/Output Summary (Last 24 hours) at 09/14/15 1020 Last data filed at 09/14/15 0904  Gross per 24 hour  Intake   1483 ml  Output   3600 ml  Net  -2117 ml    PHYSICAL EXAMINATION:  GENERAL:  55 y.o.-year-old patient lying in the bed with no acute distress.  EYES: Pupils equal, round, reactive to light and accommodation. No scleral icterus. Extraocular muscles intact.  HEENT: Head atraumatic, normocephalic. Oropharynx and nasopharynx clear.  NECK:  Supple, no jugular venous distention. No thyroid enlargement, no tenderness.  LUNGS: Normal breath sounds bilaterally, no wheezing, rales,rhonchi or crepitation. No use of accessory muscles of respiration.  CARDIOVASCULAR: S1, S2 normal. No murmurs, rubs, or gallops.  ABDOMEN: Soft, non-tender, non-distended. Bowel sounds present. No organomegaly or mass.  EXTREMITIES: No pedal edema, cyanosis, or clubbing.  NEUROLOGIC: Cranial nerves II through XII are intact. Muscle strength 5/5 in all extremities. Sensation intact. Gait not checked.  PSYCHIATRIC: The patient is alert and oriented x 3.  SKIN: No obvious rash, lesion, or ulcer.   DATA REVIEW:   CBC  Recent Labs Lab 09/14/15 0522  WBC 4.8  HGB 12.5  HCT 38.2  PLT 236    Chemistries   Recent Labs Lab  09/12/15 2124 09/14/15 0522  NA 140 137  K 3.0* 3.2*  CL 105 103  CO2 25 28  GLUCOSE 113* 105*  BUN 9 <5*  CREATININE 0.46 0.39*  CALCIUM 9.4 8.1*  AST 69*  --   ALT 50  --   ALKPHOS 87  --   BILITOT 0.7  --     Cardiac Enzymes No results for input(s): TROPONINI in the last 168 hours.  Microbiology Results  Results for orders placed or performed during the hospital encounter of 02/19/15  Wet prep, genital     Status: Abnormal   Collection Time: 02/19/15  6:31 PM  Result Value Ref Range Status   Yeast Wet Prep HPF POC NONE SEEN NONE SEEN Final   Trich, Wet Prep NONE SEEN NONE SEEN Final   Clue Cells Wet Prep HPF POC NONE SEEN NONE SEEN Final   WBC, Wet Prep HPF  POC MODERATE (A) NONE SEEN Final    Comment: MODERATE SPERM  Chlamydia/NGC rt PCR ([facility] only)     Status: None   Collection Time: 02/19/15  6:31 PM  Result Value Ref Range Status   Specimen source GC/Chlam VAGINA  Final   Chlamydia Tr NOT DETECTED NOT DETECTED Final   N gonorrhoeae NOT DETECTED NOT DETECTED Final    Comment: (NOTE) 100  This methodology has not been evaluated in pregnant women or in 200  patients with a history of hysterectomy. 300 400  This methodology will not be performed on patients less than 45  years of age.     RADIOLOGY:  Dg Abd Acute W/chest  09/13/2015  CLINICAL DATA:  Subacute onset of generalized abdominal pain and swelling. Hematemesis. Initial encounter. EXAM: DG ABDOMEN ACUTE W/ 1V CHEST COMPARISON:  CT of the abdomen and pelvis from 09/03/2015, and chest radiograph performed 10/10/2014 FINDINGS: The lungs are well-aerated. Mild peribronchial thickening is noted. There is no evidence of focal opacification, pleural effusion or pneumothorax. The cardiomediastinal silhouette is borderline normal in size. The visualized bowel gas pattern is unremarkable. Scattered stool and air are seen within the colon; there is no evidence of small bowel dilatation to suggest obstruction. No free  intra-abdominal air is identified on the provided upright view. Clips are noted within the right upper quadrant, reflecting prior cholecystectomy. No acute osseous abnormalities are seen; the sacroiliac joints are unremarkable in appearance. IMPRESSION: 1. Unremarkable bowel gas pattern; no free intra-abdominal air seen. Moderate amount of stool noted in the colon. 2. Mild peribronchial thickening noted.  Lungs otherwise clear. Electronically Signed   By: Roanna Raider M.D.   On: 09/13/2015 01:20     Management plans discussed with the patient, family and they are in agreement.  CODE STATUS:     Code Status Orders        Start     Ordered   09/13/15 0210  Full code   Continuous     09/13/15 0209    Code Status History    Date Active Date Inactive Code Status Order ID Comments User Context   This patient has a current code status but no historical code status.      TOTAL TIME TAKING CARE OF THIS PATIENT: 28 minutes.    Christy Mcbride,  Mardi Mainland.D on 09/14/2015 at 10:20 AM  Between 7am to 6pm - Pager - [phone]  After 6pm go to [email or web address] - Social research officer, government  [employer] [facility]  Office  [phone]  CC: Primary care physician; Leanna Sato, MD

## 2015-09-14 NOTE — Op Note (Signed)
[facility] Gastroenterology Patient Name: Sesilia Poucher Procedure Date: 09/14/2015 7:50 AM MRN: [id number] Account #: [id number] Date of Birth: 05-27-1961 Admit Type: Inpatient Age: 55 Room: [facility] ENDO ROOM 4 Gender: Female Note Status: Finalized Procedure:            Upper GI endoscopy Indications:          Hematemesis Providers:            Ezzard Standing. Bluford Kaufmann, MD Referring MD:         Leanna Sato, MD (Referring MD) Medicines:            Monitored Anesthesia Care Complications:        No immediate complications. Procedure:            Pre-Anesthesia Assessment:                       - Prior to the procedure, a History and Physical was                        performed, and patient medications, allergies and                        sensitivities were reviewed. The patient's tolerance of                        previous anesthesia was reviewed.                       - The risks and benefits of the procedure and the                        sedation options and risks were discussed with the                        patient. All questions were answered and informed                        consent was obtained.                       - After reviewing the risks and benefits, the patient                        was deemed in satisfactory condition to undergo the                        procedure.                       After obtaining informed consent, the endoscope was                        passed under direct vision. Throughout the procedure,                        the patient's blood pressure, pulse, and oxygen                        saturations were monitored continuously. The Endoscope  was introduced through the mouth, and advanced to the                        second part of duodenum. The upper GI endoscopy was                        accomplished without difficulty. The patient tolerated                        the procedure well. Findings:      The  examined esophagus was normal. Biopsies were taken with a cold       forceps for histology.      A single small sessile polyp was found in the gastric antrum. Biopsies       were taken with a cold forceps for histology.      Localized mildly erythematous mucosa with stigmata of recent bleeding       was found in the gastric body.      The exam was otherwise without abnormality.      The examined duodenum was normal. Impression:           - Normal esophagus. Biopsied.                       - A single gastric polyp. Biopsied.                       - Erythematous mucosa in the gastric body.                       - The examination was otherwise normal.                       - Normal examined duodenum. Recommendation:       - Discharge patient to home.                       - Observe patient's clinical course.                       - Continue present medications.                       - The findings and recommendations were discussed with                        the patient. Procedure Code(s):    --- Professional ---                       [phone], Esophagogastroduodenoscopy, flexible, transoral;                        with biopsy, single or multiple Diagnosis Code(s):    --- Professional ---                       K31.7, Polyp of stomach and duodenum                       K31.89, Other diseases of stomach and duodenum                       K92.0, Hematemesis CPT copyright  2016 American Medical Association. All rights reserved. The codes documented in this report are preliminary and upon coder review may  be revised to meet current compliance requirements. Wallace CullensPaul Y Lissandra Keil, MD 09/14/2015 8:12:35 AM This report has been signed electronically. Number of Addenda: 0 Note Initiated On: 09/14/2015 7:50 AM      [facility]

## 2015-09-14 NOTE — Transfer of Care (Signed)
Immediate Anesthesia Transfer of Care Note  Patient: Christy HeraldDiane Mcbride  Procedure(s) Performed: Procedure(s): ESOPHAGOGASTRODUODENOSCOPY (EGD) WITH PROPOFOL (N/A)  Patient Location: PACU and Endoscopy Unit  Anesthesia Type:General  Level of Consciousness: awake, alert  and oriented  Airway & Oxygen Therapy: Patient Spontanous Breathing and Patient connected to nasal cannula oxygen  Post-op Assessment: Report given to RN and Post -op Vital signs reviewed and stable  Post vital signs: Reviewed and stable  Last Vitals:  Filed Vitals:   09/14/15 0740 09/14/15 0811  BP: 138/80 159/84  Pulse: 88 87  Temp: 36.3 C 36.2 C  Resp: 18 22    Complications: No apparent anesthesia complications

## 2015-09-14 NOTE — Op Note (Signed)
EGD showed one area of irritation that may have bled recently, one small inflammatory polyp in antrum. Biopsies taken from this polyp and GE junction. No active bleeding. Reg diet ordered. Continue PPI BID for now since stomach still bothers her. Need to avoid ibuprofen. Will sign off. Thanks

## 2015-09-16 ENCOUNTER — Encounter: Payer: Self-pay | Admitting: Gastroenterology

## 2015-09-17 LAB — SURGICAL PATHOLOGY

## 2015-10-07 ENCOUNTER — Encounter: Payer: Self-pay | Admitting: Emergency Medicine

## 2015-10-07 ENCOUNTER — Emergency Department: Payer: BLUE CROSS/BLUE SHIELD

## 2015-10-07 ENCOUNTER — Emergency Department
Admission: EM | Admit: 2015-10-07 | Discharge: 2015-10-07 | Disposition: A | Discharge status: Home / Self Care | Payer: BLUE CROSS/BLUE SHIELD | Attending: Emergency Medicine | Admitting: Emergency Medicine

## 2015-10-07 DIAGNOSIS — R42 Dizziness and giddiness: Secondary | ICD-10-CM | POA: Diagnosis not present

## 2015-10-07 DIAGNOSIS — R531 Weakness: Secondary | ICD-10-CM | POA: Insufficient documentation

## 2015-10-07 DIAGNOSIS — R296 Repeated falls: Secondary | ICD-10-CM

## 2015-10-07 DIAGNOSIS — Z87891 Personal history of nicotine dependence: Secondary | ICD-10-CM | POA: Insufficient documentation

## 2015-10-07 DIAGNOSIS — I10 Essential (primary) hypertension: Secondary | ICD-10-CM | POA: Diagnosis not present

## 2015-10-07 DIAGNOSIS — Z7984 Long term (current) use of oral hypoglycemic drugs: Secondary | ICD-10-CM | POA: Insufficient documentation

## 2015-10-07 DIAGNOSIS — M5136 Other intervertebral disc degeneration, lumbar region: Secondary | ICD-10-CM | POA: Diagnosis not present

## 2015-10-07 DIAGNOSIS — E079 Disorder of thyroid, unspecified: Secondary | ICD-10-CM | POA: Diagnosis not present

## 2015-10-07 DIAGNOSIS — Z880 Allergy status to penicillin: Secondary | ICD-10-CM | POA: Diagnosis not present

## 2015-10-07 DIAGNOSIS — E119 Type 2 diabetes mellitus without complications: Secondary | ICD-10-CM | POA: Diagnosis not present

## 2015-10-07 DIAGNOSIS — W19XXXA Unspecified fall, initial encounter: Secondary | ICD-10-CM

## 2015-10-07 DIAGNOSIS — T07XXXA Unspecified multiple injuries, initial encounter: Secondary | ICD-10-CM

## 2015-10-07 LAB — COMPREHENSIVE METABOLIC PANEL
ALK PHOS: 86 U/L (ref 38–126)
ALT: 66 U/L — ABNORMAL HIGH (ref 14–54)
ANION GAP: 13 (ref 5–15)
AST: 69 U/L — ABNORMAL HIGH (ref 15–41)
Albumin: 4.8 g/dL (ref 3.5–5.0)
BUN: 15 mg/dL (ref 6–20)
CO2: 25 mmol/L (ref 22–32)
Calcium: 9.3 mg/dL (ref 8.9–10.3)
Chloride: 97 mmol/L — ABNORMAL LOW (ref 101–111)
Creatinine, Ser: 1.26 mg/dL — ABNORMAL HIGH (ref 0.44–1.00)
GFR calc non Af Amer: 47 mL/min — ABNORMAL LOW (ref >60)
GFR, EST AFRICAN AMERICAN: 55 mL/min — AB (ref >60)
GLUCOSE: 100 mg/dL — AB (ref 65–99)
Potassium: 3.6 mmol/L (ref 3.5–5.1)
SODIUM: 135 mmol/L (ref 135–145)
Total Bilirubin: 1 mg/dL (ref 0.3–1.2)
Total Protein: 7.7 g/dL (ref 6.5–8.1)

## 2015-10-07 LAB — URINALYSIS COMPLETE WITH MICROSCOPIC (ARMC ONLY)
Bilirubin Urine: NEGATIVE
Glucose, UA: NEGATIVE mg/dL
HGB URINE DIPSTICK: NEGATIVE
Ketones, ur: NEGATIVE mg/dL
Nitrite: NEGATIVE
PROTEIN: NEGATIVE mg/dL
SPECIFIC GRAVITY, URINE: 1.008 (ref 1.005–1.030)
pH: 5 (ref 5.0–8.0)

## 2015-10-07 LAB — CBC
HCT: 39.9 % (ref 35.0–47.0)
HEMOGLOBIN: 12.9 g/dL (ref 12.0–16.0)
MCH: 26.1 pg (ref 26.0–34.0)
MCHC: 32.4 g/dL (ref 32.0–36.0)
MCV: 80.7 fL (ref 80.0–100.0)
Platelets: 281 10*3/uL (ref 150–440)
RBC: 4.94 MIL/uL (ref 3.80–5.20)
RDW: 14.4 % (ref 11.5–14.5)
WBC: 7.2 10*3/uL (ref 3.6–11.0)

## 2015-10-07 LAB — URINE DRUG SCREEN, QUALITATIVE (ARMC ONLY)
Amphetamines, Ur Screen: NOT DETECTED
BARBITURATES, UR SCREEN: NOT DETECTED
BENZODIAZEPINE, UR SCRN: POSITIVE — AB
CANNABINOID 50 NG, UR ~~LOC~~: NOT DETECTED
COCAINE METABOLITE, UR ~~LOC~~: NOT DETECTED
MDMA (Ecstasy)Ur Screen: NOT DETECTED
Methadone Scn, Ur: NOT DETECTED
Opiate, Ur Screen: NOT DETECTED
Phencyclidine (PCP) Ur S: NOT DETECTED
TRICYCLIC, UR SCREEN: NOT DETECTED

## 2015-10-07 LAB — CK: Total CK: 947 U/L — ABNORMAL HIGH (ref 38–234)

## 2015-10-07 MED ORDER — OXYCODONE-ACETAMINOPHEN 5-325 MG PO TABS
1.0000 | ORAL_TABLET | Freq: Once | ORAL | Status: AC
  Administered 2015-10-07: 1 via ORAL

## 2015-10-07 MED ORDER — ONDANSETRON HCL 4 MG/2ML IJ SOLN
INTRAMUSCULAR | Status: AC
  Administered 2015-10-07: 4 mg via INTRAVENOUS
  Filled 2015-10-07: qty 2

## 2015-10-07 MED ORDER — MORPHINE SULFATE (PF) 4 MG/ML IV SOLN
4.0000 mg | Freq: Once | INTRAVENOUS | Status: AC
  Administered 2015-10-07: 4 mg via INTRAVENOUS

## 2015-10-07 MED ORDER — MORPHINE SULFATE (PF) 4 MG/ML IV SOLN
INTRAVENOUS | Status: AC
  Administered 2015-10-07: 4 mg via INTRAVENOUS
  Filled 2015-10-07: qty 1

## 2015-10-07 MED ORDER — OXYCODONE-ACETAMINOPHEN 5-325 MG PO TABS
ORAL_TABLET | ORAL | Status: AC
  Administered 2015-10-07: 1 via ORAL
  Filled 2015-10-07: qty 1

## 2015-10-07 MED ORDER — SODIUM CHLORIDE 0.9 % IV BOLUS (SEPSIS)
1000.0000 mL | Freq: Once | INTRAVENOUS | Status: AC
  Administered 2015-10-07: 1000 mL via INTRAVENOUS

## 2015-10-07 MED ORDER — ONDANSETRON HCL 4 MG/2ML IJ SOLN
4.0000 mg | Freq: Once | INTRAMUSCULAR | Status: AC
  Administered 2015-10-07: 4 mg via INTRAVENOUS

## 2015-10-07 MED ORDER — HYDROCODONE-ACETAMINOPHEN 5-325 MG PO TABS: 1.0000 | ORAL_TABLET | ORAL | Status: DC | PRN

## 2015-10-07 NOTE — ED Provider Notes (Signed)
[facility] Emergency Department Provider Note  Time seen: 4:20 PM  I have reviewed the triage vital signs and the nursing notes.   HISTORY  Chief Complaint Loss of Consciousness; Dizziness; and Shoulder Pain    HPI Christy Mcbride is a 55 y.o. female with a past medical history of diabetes, hypertension, presents to the emergency department for generalized weakness and falls. Patient states yesterday she fell 4 times. She is not exactly sure why. Denies alcohol use. States when she got up she would feel dizzy, with one of the fall she fell hitting her head with a brief LOC. Patient states she went to work today, and was able to stay there for a couple hours but began feeling weak and dizzy once again so they told her to leave and get checked out. Here patient states mild diffuse back pain, moderate neck pain, moderate headache, mild left shoulder pain. Describes the headache is dull aching.     Past Medical History  Diagnosis Date  . Diabetes mellitus without complication (HCC)   . Thyroid disease   . Liver disease   . Herniated cervical disc   . Hypertension   . Allergy   . Sleep apnea     Patient Active Problem List   Diagnosis Date Noted  . GI bleed 09/13/2015  . DDD (degenerative disc disease), lumbar 03/19/2015  . Lumbar radiculopathy 03/19/2015  . Facet syndrome, lumbar 03/19/2015  . Bilateral occipital neuralgia 03/19/2015    Past Surgical History  Procedure Laterality Date  . Ablation    . Cholecystectomy    . Cesarean section    . Liver biopsy    . Esophagogastroduodenoscopy (egd) with propofol N/A 09/14/2015    Procedure: ESOPHAGOGASTRODUODENOSCOPY (EGD) WITH PROPOFOL;  Surgeon: Wallace Cullens, MD;  Location: [facility] ENDOSCOPY;  Service: Endoscopy;  Laterality: N/A;    Current Outpatient Rx  Name  Route  Sig  Dispense  Refill  . diazepam (VALIUM) 5 MG tablet   Oral   Take 5 mg by mouth every 12 (twelve) hours as needed for anxiety.         . ERGOCALCIFEROL PO   Oral   Take 1.25 mg by mouth once a week.         . furosemide (LASIX) 20 MG tablet   Oral   Take 20 mg by mouth as needed.          . metFORMIN (GLUMETZA) 500 MG (MOD) 24 hr tablet   Oral   Take 500 mg by mouth daily with breakfast.         . pantoprazole (PROTONIX) 40 MG tablet   Oral   Take 1 tablet (40 mg total) by mouth 2 (two) times daily.   60 tablet   0   . PARoxetine (PAXIL) 40 MG tablet   Oral   Take 40 mg by mouth daily.         Marland Kitchen rOPINIRole (REQUIP) 1 MG tablet   Oral   Take 1 mg by mouth at bedtime.         Marland Kitchen tiZANidine (ZANAFLEX) 4 MG capsule   Oral   Take 1 capsule by mouth 4 (four) times daily as needed.         . cyclobenzaprine (FLEXERIL) 10 MG tablet   Oral   Take 1 tablet (10 mg total) by mouth every 8 (eight) hours as needed for muscle spasms.   30 tablet   1   . simethicone (GAS-X) 80  MG chewable tablet   Oral   Chew 1 tablet (80 mg total) by mouth every 6 (six) hours as needed for flatulence.   30 tablet   0     Allergies Sulfa antibiotics; Fentanyl; Penicillins; and Topiramate  Family History  Problem Relation Age of Onset  . Hypertension Mother   . Stroke Mother   . Diabetes Father   . Heart disease Father     Social History Social History  Substance Use Topics  . Smoking status: Former Smoker    Types: Cigarettes  . Smokeless tobacco: None  . Alcohol Use: Yes     Comment: occas.    Review of Systems Constitutional: Negative for fever. Cardiovascular: Negative for chest pain. Respiratory: Negative for shortness of breath. Gastrointestinal: Negative for abdominal pain Musculoskeletal: Mild diffuse back pain. Moderate neck pain. Mild left shoulder pain. Neurological: Moderate headache. No focal weakness or numbness. 10-point ROS otherwise negative.  ____________________________________________   PHYSICAL EXAM:  VITAL SIGNS: ED Triage Vitals  Enc Vitals Group     BP 10/07/15  1435 134/77 mmHg     Pulse Rate 10/07/15 1435 87     Resp 10/07/15 1435 18     Temp 10/07/15 1435 97.9 F (36.6 C)     Temp Source 10/07/15 1435 Oral     SpO2 10/07/15 1435 100 %     Weight 10/07/15 1435 145 lb (65.772 kg)     Height 10/07/15 1435 5\' 2"  (1.575 m)     Head Cir --      Peak Flow --      Pain Score 10/07/15 1458 5     Pain Loc --      Pain Edu? --      Excl. in GC? --     Constitutional: Alert and oriented. Well appearing and in no distress. Eyes: Normal exam ENT   Head: Normocephalic and atraumatic.   Mouth/Throat: Mucous membranes are moist. Cardiovascular: Normal rate, regular rhythm. No murmur Respiratory: Normal respiratory effort without tachypnea nor retractions. Breath sounds are clear  Gastrointestinal: Soft and nontender. No distention.   Musculoskeletal: Mild paraspinal back pain over the T and L spines. Moderate cervical spine tenderness to palpation mostly on the left paraspinal areas. Mild left shoulder tenderness to palpation with good range of motion. Patient able to ambulate without difficulty. Neurologic:  Normal speech and language. No gross focal neurologic deficits Skin:  Skin is warm, dry and intact.  Psychiatric: Mood and affect are normal.   ____________________________________________    EKG  EKG reviewed and interpreted by myself shows normal sinus rhythm at 72 bpm, narrow QRS, normal axis, normal intervals, no ST changes. Normal EKG.  ____________________________________________    RADIOLOGY  CT head and neck show no acute abnormality.  ____________________________________________   INITIAL IMPRESSION / ASSESSMENT AND PLAN / ED COURSE  Pertinent labs & imaging results that were available during my care of the patient were reviewed by me and considered in my medical decision making (see chart for details).  The patient presents the emergency department after 4 falls yesterday. Patient is not sure why she fell 4 times.  She did state she hit her head and lost consciousness with one of the falls. Patient was able to go to work today for several hours but began feeling weak and dizzy so she left to came to the emergency department for further evaluation. Overall the patient has a good neurologic exam, overall normal physical exam besides neck pain with mild  back pain which appears to be more muscular as well as muscular shoulder pain with good range of motion, no concern for fracture or dislocation. We will check a CT head, C-spine, and lab work. Patient agreeable to plan.   CTs are largely negative. I discussed the patient's thyroid nodule with the patient and PCP follow-up. Patient's labs have resulted showing an elevated CK 900. At this time it is not clear whether patient has an elevated CK. Patient is receiving IV fluids.. Patient's labs otherwise appear well. I discussed with the patient drinking plenty fluids and following up with a primary care physician tomorrow as recheck or CK. Patient agreeable to plan.  Continue to wait for fluids to infuse. Patient will follow up with her PCP tomorrow for recheck. I discussed with the patient the reasons for an elevated CK being something as simple as bruising, but given her recent falls plus elevation in CK would be concerned over the next 2 days the CK level did not begin to decline. Patient understands and will follow up with her PCP for recheck within the next 48 hours. Patient continues to have a headache. We will dose pain medication for the headache, CTs negative. ____________________________________________   FINAL CLINICAL IMPRESSION(S) / ED DIAGNOSES  Falls Dizziness Weakness   Minna Antis, MD 10/07/15 2000

## 2015-10-07 NOTE — ED Notes (Signed)
Pt states she fell 4 times yesterday, the first fall, she fell backwards into the bathtub, fell 2 more times on the way to the bathroom (pt has IBS), the last fall yesterday was a syncopal episode, states she can't remember the facts about that fall. Pt states she has never fallen like this before. Speech clear, grips weak but equal bilaterally, drove here, however, states she is dizzy, and her "eyes feel weird." Left arm pain, from the fall she thinks. She began a new medication last week. Now states her chest is feeling tight.

## 2015-10-07 NOTE — Discharge Instructions (Signed)
You have been seen in the emergency department today for dizziness, and multiple falls. Your workup shows an elevation of your creatinine kinase a muscle breakdown product. Your level today is 900. As we discussed please drink plenty fluids over the next 1-2 days and follow-up with your primary care physician in the next 2 days for reevaluation, and recheck of your creatinine kinase level. Your CT scan today of your head and neck do not show any acute injury, however a 2.2 cm fibroid lesion was identified which we would recommend further workup on a nonemergent basis with a thyroid ultrasound in the near future. Please return to the emergency department for any further falls, weakness, or any other symptom personally concerning to your self.    Fall Prevention in the Home  Falls can cause injuries. They can happen to people of all ages. There are many things you can do to make your home safe and to help prevent falls.  WHAT CAN I DO ON THE OUTSIDE OF MY HOME?  Regularly fix the edges of walkways and driveways and fix any cracks.  Remove anything that might make you trip as you walk through a door, such as a raised step or threshold.  Trim any bushes or trees on the path to your home.  Use bright outdoor lighting.  Clear any walking paths of anything that might make someone trip, such as rocks or tools.  Regularly check to see if handrails are loose or broken. Make sure that both sides of any steps have handrails.  Any raised decks and porches should have guardrails on the edges.  Have any leaves, snow, or ice cleared regularly.  Use sand or salt on walking paths during winter.  Clean up any spills in your garage right away. This includes oil or grease spills. WHAT CAN I DO IN THE BATHROOM?   Use night lights.  Install grab bars by the toilet and in the tub and shower. Do not use towel bars as grab bars.  Use non-skid mats or decals in the tub or shower.  If you need to sit down in  the shower, use a plastic, non-slip stool.  Keep the floor dry. Clean up any water that spills on the floor as soon as it happens.  Remove soap buildup in the tub or shower regularly.  Attach bath mats securely with double-sided non-slip rug tape.  Do not have throw rugs and other things on the floor that can make you trip. WHAT CAN I DO IN THE BEDROOM?  Use night lights.  Make sure that you have a light by your bed that is easy to reach.  Do not use any sheets or blankets that are too big for your bed. They should not hang down onto the floor.  Have a firm chair that has side arms. You can use this for support while you get dressed.  Do not have throw rugs and other things on the floor that can make you trip. WHAT CAN I DO IN THE KITCHEN?  Clean up any spills right away.  Avoid walking on wet floors.  Keep items that you use a lot in easy-to-reach places.  If you need to reach something above you, use a strong step stool that has a grab bar.  Keep electrical cords out of the way.  Do not use floor polish or wax that makes floors slippery. If you must use wax, use non-skid floor wax.  Do not have throw rugs and other  things on the floor that can make you trip. WHAT CAN I DO WITH MY STAIRS?  Do not leave any items on the stairs.  Make sure that there are handrails on both sides of the stairs and use them. Fix handrails that are broken or loose. Make sure that handrails are as long as the stairways.  Check any carpeting to make sure that it is firmly attached to the stairs. Fix any carpet that is loose or worn.  Avoid having throw rugs at the top or bottom of the stairs. If you do have throw rugs, attach them to the floor with carpet tape.  Make sure that you have a light switch at the top of the stairs and the bottom of the stairs. If you do not have them, ask someone to add them for you. WHAT ELSE CAN I DO TO HELP PREVENT FALLS?  Wear shoes that:  Do not have high  heels.  Have rubber bottoms.  Are comfortable and fit you well.  Are closed at the toe. Do not wear sandals.  If you use a stepladder:  Make sure that it is fully opened. Do not climb a closed stepladder.  Make sure that both sides of the stepladder are locked into place.  Ask someone to hold it for you, if possible.  Clearly mark and make sure that you can see:  Any grab bars or handrails.  First and last steps.  Where the edge of each step is.  Use tools that help you move around (mobility aids) if they are needed. These include:  Canes.  Walkers.  Scooters.  Crutches.  Turn on the lights when you go into a dark area. Replace any light bulbs as soon as they burn out.  Set up your furniture so you have a clear path. Avoid moving your furniture around.  If any of your floors are uneven, fix them.  If there are any pets around you, be aware of where they are.  Review your medicines with your doctor. Some medicines can make you feel dizzy. This can increase your chance of falling. Ask your doctor what other things that you can do to help prevent falls.   This information is not intended to replace advice given to you by your health care provider. Make sure you discuss any questions you have with your health care provider.   Document Released: 03/14/2009 Document Revised: 10/02/2014 Document Reviewed: 06/22/2014 Elsevier Interactive Patient Education [employer]

## 2015-10-07 NOTE — ED Notes (Signed)
Says she fell 4 times yesterday.  Passed out after getting up on way to bathroom.  Also hit head.  Fell into bathtub.    Says she went to work today and they were worried she had concussion so they told her to come to the hospital.  Pt is in nad now.  Says pain is mostly in left side neck and back.

## 2015-10-19 ENCOUNTER — Observation Stay
Admission: EM | Admit: 2015-10-19 | Discharge: 2015-10-20 | Disposition: A | Discharge status: Home / Self Care | Payer: BLUE CROSS/BLUE SHIELD | Attending: Internal Medicine | Admitting: Internal Medicine

## 2015-10-19 ENCOUNTER — Encounter: Payer: Self-pay | Admitting: Emergency Medicine

## 2015-10-19 DIAGNOSIS — M47812 Spondylosis without myelopathy or radiculopathy, cervical region: Secondary | ICD-10-CM | POA: Diagnosis not present

## 2015-10-19 DIAGNOSIS — R55 Syncope and collapse: Principal | ICD-10-CM | POA: Diagnosis present

## 2015-10-19 DIAGNOSIS — Z888 Allergy status to other drugs, medicaments and biological substances status: Secondary | ICD-10-CM | POA: Insufficient documentation

## 2015-10-19 DIAGNOSIS — Z79899 Other long term (current) drug therapy: Secondary | ICD-10-CM | POA: Insufficient documentation

## 2015-10-19 DIAGNOSIS — Z7984 Long term (current) use of oral hypoglycemic drugs: Secondary | ICD-10-CM | POA: Diagnosis not present

## 2015-10-19 DIAGNOSIS — I081 Rheumatic disorders of both mitral and tricuspid valves: Secondary | ICD-10-CM | POA: Insufficient documentation

## 2015-10-19 DIAGNOSIS — Z882 Allergy status to sulfonamides status: Secondary | ICD-10-CM | POA: Insufficient documentation

## 2015-10-19 DIAGNOSIS — Z880 Allergy status to penicillin: Secondary | ICD-10-CM | POA: Insufficient documentation

## 2015-10-19 DIAGNOSIS — I959 Hypotension, unspecified: Secondary | ICD-10-CM | POA: Diagnosis not present

## 2015-10-19 DIAGNOSIS — M79602 Pain in left arm: Secondary | ICD-10-CM | POA: Diagnosis not present

## 2015-10-19 DIAGNOSIS — G8929 Other chronic pain: Secondary | ICD-10-CM | POA: Diagnosis not present

## 2015-10-19 DIAGNOSIS — R42 Dizziness and giddiness: Secondary | ICD-10-CM | POA: Diagnosis not present

## 2015-10-19 DIAGNOSIS — Z87891 Personal history of nicotine dependence: Secondary | ICD-10-CM | POA: Diagnosis not present

## 2015-10-19 DIAGNOSIS — Z881 Allergy status to other antibiotic agents status: Secondary | ICD-10-CM | POA: Insufficient documentation

## 2015-10-19 DIAGNOSIS — R531 Weakness: Secondary | ICD-10-CM | POA: Insufficient documentation

## 2015-10-19 DIAGNOSIS — E119 Type 2 diabetes mellitus without complications: Secondary | ICD-10-CM | POA: Insufficient documentation

## 2015-10-19 DIAGNOSIS — I10 Essential (primary) hypertension: Secondary | ICD-10-CM | POA: Insufficient documentation

## 2015-10-19 DIAGNOSIS — F419 Anxiety disorder, unspecified: Secondary | ICD-10-CM | POA: Diagnosis not present

## 2015-10-19 DIAGNOSIS — Z9049 Acquired absence of other specified parts of digestive tract: Secondary | ICD-10-CM | POA: Diagnosis not present

## 2015-10-19 DIAGNOSIS — E039 Hypothyroidism, unspecified: Secondary | ICD-10-CM | POA: Insufficient documentation

## 2015-10-19 DIAGNOSIS — R569 Unspecified convulsions: Secondary | ICD-10-CM

## 2015-10-19 LAB — CBC WITH DIFFERENTIAL/PLATELET
Basophils Absolute: 0.1 K/uL (ref 0–0.1)
Basophils Relative: 1 %
Eosinophils Absolute: 0.3 K/uL (ref 0–0.7)
Eosinophils Relative: 4 %
HCT: 37.2 % (ref 35.0–47.0)
Hemoglobin: 12.3 g/dL (ref 12.0–16.0)
Lymphocytes Relative: 28 %
Lymphs Abs: 2.1 K/uL (ref 1.0–3.6)
MCH: 26.2 pg (ref 26.0–34.0)
MCHC: 33.2 g/dL (ref 32.0–36.0)
MCV: 78.8 fL — ABNORMAL LOW (ref 80.0–100.0)
Monocytes Absolute: 0.8 K/uL (ref 0.2–0.9)
Monocytes Relative: 11 %
Neutro Abs: 4.1 K/uL (ref 1.4–6.5)
Neutrophils Relative %: 56 %
Platelets: 252 K/uL (ref 150–440)
RBC: 4.72 MIL/uL (ref 3.80–5.20)
RDW: 14 % (ref 11.5–14.5)
WBC: 7.4 K/uL (ref 3.6–11.0)

## 2015-10-19 LAB — HEMOGLOBIN A1C: HEMOGLOBIN A1C: 6.8 % — AB (ref 4.0–6.0)

## 2015-10-19 LAB — COMPREHENSIVE METABOLIC PANEL
ALT: 40 U/L (ref 14–54)
AST: 54 U/L — AB (ref 15–41)
Albumin: 4.2 g/dL (ref 3.5–5.0)
Alkaline Phosphatase: 83 U/L (ref 38–126)
Anion gap: 9 (ref 5–15)
BUN: 10 mg/dL (ref 6–20)
CHLORIDE: 101 mmol/L (ref 101–111)
CO2: 26 mmol/L (ref 22–32)
Calcium: 9.5 mg/dL (ref 8.9–10.3)
Creatinine, Ser: 0.82 mg/dL (ref 0.44–1.00)
Glucose, Bld: 122 mg/dL — ABNORMAL HIGH (ref 65–99)
POTASSIUM: 4 mmol/L (ref 3.5–5.1)
SODIUM: 136 mmol/L (ref 135–145)
Total Bilirubin: 0.7 mg/dL (ref 0.3–1.2)
Total Protein: 7.2 g/dL (ref 6.5–8.1)

## 2015-10-19 LAB — URINE DRUG SCREEN, QUALITATIVE (ARMC ONLY)
Amphetamines, Ur Screen: NOT DETECTED
Barbiturates, Ur Screen: NOT DETECTED
Benzodiazepine, Ur Scrn: POSITIVE — AB
Cannabinoid 50 Ng, Ur ~~LOC~~: NOT DETECTED
Cocaine Metabolite,Ur ~~LOC~~: NOT DETECTED
MDMA (Ecstasy)Ur Screen: NOT DETECTED
Methadone Scn, Ur: NOT DETECTED
Opiate, Ur Screen: NOT DETECTED
Phencyclidine (PCP) Ur S: NOT DETECTED
Tricyclic, Ur Screen: NOT DETECTED

## 2015-10-19 LAB — GLUCOSE, CAPILLARY
GLUCOSE-CAPILLARY: 114 mg/dL — AB (ref 65–99)
Glucose-Capillary: 101 mg/dL — ABNORMAL HIGH (ref 65–99)

## 2015-10-19 LAB — TROPONIN I: Troponin I: <0.03 ng/mL (ref <0.031)

## 2015-10-19 LAB — TSH: TSH: 3.267 u[IU]/mL (ref 0.350–4.500)

## 2015-10-19 LAB — ETHANOL

## 2015-10-19 MED ORDER — TRAZODONE HCL 50 MG PO TABS
50.0000 mg | ORAL_TABLET | Freq: Every day | ORAL | Status: DC
  Administered 2015-10-19: 50 mg via ORAL
  Filled 2015-10-19: qty 1

## 2015-10-19 MED ORDER — LINACLOTIDE 145 MCG PO CAPS
145.0000 ug | ORAL_CAPSULE | Freq: Every day | ORAL | Status: DC
  Filled 2015-10-19 (×3): qty 1

## 2015-10-19 MED ORDER — SODIUM CHLORIDE 0.9% FLUSH
3.0000 mL | Freq: Two times a day (BID) | INTRAVENOUS | Status: DC
  Administered 2015-10-19 – 2015-10-20 (×2): 3 mL via INTRAVENOUS

## 2015-10-19 MED ORDER — ALBUTEROL SULFATE (2.5 MG/3ML) 0.083% IN NEBU: 2.5000 mg | INHALATION_SOLUTION | RESPIRATORY_TRACT | Status: DC | PRN

## 2015-10-19 MED ORDER — PANTOPRAZOLE SODIUM 40 MG PO TBEC
40.0000 mg | DELAYED_RELEASE_TABLET | Freq: Two times a day (BID) | ORAL | Status: DC
  Administered 2015-10-19 – 2015-10-20 (×3): 40 mg via ORAL
  Filled 2015-10-19 (×3): qty 1

## 2015-10-19 MED ORDER — ACETAMINOPHEN 650 MG RE SUPP: 650.0000 mg | Freq: Four times a day (QID) | RECTAL | Status: DC | PRN

## 2015-10-19 MED ORDER — SODIUM CHLORIDE 0.9 % IV SOLN
Freq: Once | INTRAVENOUS | Status: AC
  Administered 2015-10-19: 13:00:00 via INTRAVENOUS

## 2015-10-19 MED ORDER — ASPIRIN EC 81 MG PO TBEC
81.0000 mg | DELAYED_RELEASE_TABLET | Freq: Every day | ORAL | Status: DC
  Administered 2015-10-19 – 2015-10-20 (×2): 81 mg via ORAL
  Filled 2015-10-19 (×2): qty 1

## 2015-10-19 MED ORDER — ACETAMINOPHEN 325 MG PO TABS
650.0000 mg | ORAL_TABLET | Freq: Four times a day (QID) | ORAL | Status: DC | PRN
  Administered 2015-10-19 – 2015-10-20 (×2): 650 mg via ORAL
  Filled 2015-10-19 (×2): qty 2

## 2015-10-19 MED ORDER — POLYETHYLENE GLYCOL 3350 17 G PO PACK: 17.0000 g | PACK | Freq: Every day | ORAL | Status: DC | PRN

## 2015-10-19 MED ORDER — ENOXAPARIN SODIUM 40 MG/0.4ML ~~LOC~~ SOLN
40.0000 mg | SUBCUTANEOUS | Status: DC
  Administered 2015-10-19: 40 mg via SUBCUTANEOUS
  Filled 2015-10-19: qty 0.4

## 2015-10-19 MED ORDER — ROPINIROLE HCL 1 MG PO TABS
1.0000 mg | ORAL_TABLET | Freq: Every day | ORAL | Status: DC
  Administered 2015-10-19: 1 mg via ORAL
  Filled 2015-10-19: qty 1

## 2015-10-19 MED ORDER — TIZANIDINE HCL 4 MG PO TABS
4.0000 mg | ORAL_TABLET | Freq: Four times a day (QID) | ORAL | Status: DC | PRN
  Filled 2015-10-19: qty 1

## 2015-10-19 MED ORDER — INSULIN ASPART 100 UNIT/ML ~~LOC~~ SOLN
0.0000 [IU] | Freq: Three times a day (TID) | SUBCUTANEOUS | Status: DC
  Administered 2015-10-20: 1 [IU] via SUBCUTANEOUS
  Filled 2015-10-19: qty 1

## 2015-10-19 MED ORDER — PAROXETINE HCL 20 MG PO TABS
40.0000 mg | ORAL_TABLET | Freq: Every day | ORAL | Status: DC
  Administered 2015-10-19 – 2015-10-20 (×2): 40 mg via ORAL
  Filled 2015-10-19 (×3): qty 2

## 2015-10-19 MED ORDER — SODIUM CHLORIDE 0.9% FLUSH: 3.0000 mL | INTRAVENOUS | Status: DC | PRN

## 2015-10-19 MED ORDER — SODIUM CHLORIDE 0.9 % IV SOLN: 250.0000 mL | INTRAVENOUS | Status: DC | PRN

## 2015-10-19 MED ORDER — BISACODYL 5 MG PO TBEC: 5.0000 mg | DELAYED_RELEASE_TABLET | Freq: Every day | ORAL | Status: DC | PRN

## 2015-10-19 MED ORDER — ONDANSETRON HCL 4 MG PO TABS: 4.0000 mg | ORAL_TABLET | Freq: Four times a day (QID) | ORAL | Status: DC | PRN

## 2015-10-19 MED ORDER — METFORMIN HCL ER 500 MG PO TB24
500.0000 mg | ORAL_TABLET | Freq: Every day | ORAL | Status: DC
  Administered 2015-10-20: 500 mg via ORAL
  Filled 2015-10-19 (×2): qty 1

## 2015-10-19 MED ORDER — CYCLOBENZAPRINE HCL 10 MG PO TABS: 10.0000 mg | ORAL_TABLET | Freq: Three times a day (TID) | ORAL | Status: DC | PRN

## 2015-10-19 MED ORDER — ONDANSETRON HCL 4 MG/2ML IJ SOLN: 4.0000 mg | Freq: Four times a day (QID) | INTRAMUSCULAR | Status: DC | PRN

## 2015-10-19 NOTE — ED Notes (Signed)
Report given to Jessica, RN.

## 2015-10-19 NOTE — ED Notes (Signed)
This RN explained to patient that she was going to go upstairs at this time. Pt states understanding. NAD noted. Gerilyn PilgrimJacob, EDT to take patient up to the floor at this time.

## 2015-10-19 NOTE — ED Provider Notes (Signed)
[facility] Emergency Department Provider Note        Time seen: ----------------------------------------- 10:07 AM on 10/19/2015 -----------------------------------------    I have reviewed the triage vital signs and the nursing notes.   HISTORY  Chief Complaint No chief complaint on file.    HPI Christy Mcbride is a 55 y.o. female who presents to ER after a seizure like event. Patient went to a baseball and subsequently fell started having seizure-like activity. Patient was recently seen in the ER for multiple falls. She saw her primary care doctor yesterday and the report is that she has been referred to a neurologist. She did not bite her tongue, was not incontinent, it was unclear whether or not there was a postictal period. Patient denies complaints at this time   Past Medical History  Diagnosis Date  . Diabetes mellitus without complication (HCC)   . Thyroid disease   . Liver disease   . Herniated cervical disc   . Hypertension   . Allergy   . Sleep apnea     Patient Active Problem List   Diagnosis Date Noted  . GI bleed 09/13/2015  . DDD (degenerative disc disease), lumbar 03/19/2015  . Lumbar radiculopathy 03/19/2015  . Facet syndrome, lumbar 03/19/2015  . Bilateral occipital neuralgia 03/19/2015    Past Surgical History  Procedure Laterality Date  . Ablation    . Cholecystectomy    . Cesarean section    . Liver biopsy    . Esophagogastroduodenoscopy (egd) with propofol N/A 09/14/2015    Procedure: ESOPHAGOGASTRODUODENOSCOPY (EGD) WITH PROPOFOL;  Surgeon: Wallace Cullens, MD;  Location: [facility] ENDOSCOPY;  Service: Endoscopy;  Laterality: N/A;    Allergies Sulfa antibiotics; Fentanyl; Penicillins; and Topiramate  Social History Social History  Substance Use Topics  . Smoking status: Former Smoker    Types: Cigarettes  . Smokeless tobacco: Not on file  . Alcohol Use: Yes     Comment: occas.    Review of Systems Constitutional:  Negative for fever. Eyes: Negative for visual changes. ENT: Negative for sore throat. Cardiovascular: Negative for chest pain. Respiratory: Negative for shortness of breath. Gastrointestinal: Negative for abdominal pain, vomiting and diarrhea. Genitourinary: Negative for dysuria. Musculoskeletal: Negative for back pain. Skin: Negative for rash. Neurological: Negative for headaches, focal weakness or numbness. Psychiatric: Negative for anxiety and depression  10-point ROS otherwise negative.  ____________________________________________   PHYSICAL EXAM:  VITAL SIGNS: ED Triage Vitals  Enc Vitals Group     BP --      Pulse --      Resp --      Temp --      Temp src --      SpO2 --      Weight --      Height --      Head Cir --      Peak Flow --      Pain Score --      Pain Loc --      Pain Edu? --      Excl. in GC? --     Constitutional: Alert and oriented and in no distress. Eyes: Conjunctivae are normal. PERRL. Normal extraocular movements. ENT   Head: Normocephalic and atraumatic.   Nose: No congestion/rhinnorhea.   Mouth/Throat: Mucous membranes are moist.   Neck: No stridor. Cardiovascular: Normal rate, regular rhythm. No murmurs, rubs, or gallops. Respiratory: Normal respiratory effort without tachypnea nor retractions. Breath sounds are clear and equal bilaterally. No wheezes/rales/rhonchi. Gastrointestinal: Soft  and nontender. Normal bowel sounds Musculoskeletal: Nontender with normal range of motion in all extremities. No lower extremity tenderness nor edema. Neurologic:  Normal speech and language. No gross focal neurologic deficits are appreciated.  Skin:  Skin is warm, dry and intact. No rash noted. Psychiatric: Depressed mood and affect ____________________________________________  EKG: Interpreted by me. Sinus rhythm with a rate of 52 bpm, first-degree AV block, normal QRS, normal QT interval. Normal  axis.  ____________________________________________  ED COURSE:  Pertinent labs & imaging results that were available during my care of the patient were reviewed by me and considered in my medical decision making (see chart for details). Patient is in no acute distress, likely syncope versus pseudoseizure. I will recheck basic labs. ____________________________________________    LABS (pertinent positives/negatives)  Labs Reviewed  CBC WITH DIFFERENTIAL/PLATELET - Abnormal; Notable for the following:    MCV 78.8 (*)    All other components within normal limits  COMPREHENSIVE METABOLIC PANEL - Abnormal; Notable for the following:    Glucose, Bld 122 (*)    AST 54 (*)    All other components within normal limits  TSH  ETHANOL  URINE DRUG SCREEN, QUALITATIVE ([facility] ONLY)    ____________________________________________  FINAL ASSESSMENT AND PLAN  Syncope  Plan: Patient with labs as dictated above. Patient is in no acute distress but did have several episodes of hypotension which may explain her syncopal events. Patient was seen in the ER 12 days ago for same without explanation. I'll recommend observation and a workup for syncope.   Emily FilbertWilliams, Jonathan E, MD   Note: This dictation was prepared with Dragon dictation. Any transcriptional errors that result from this process are unintentional   Emily FilbertJonathan E Williams, MD 10/19/15 1236

## 2015-10-19 NOTE — H&P (Signed)
[facility] - [place] at [facility]   PATIENT NAME: Christy Mcbride    MR#:  [id number]  DATE OF BIRTH:  May 11, 1961  DATE OF ADMISSION:  10/19/2015  PRIMARY CARE PHYSICIAN: Leanna Sato, MD   REQUESTING/REFERRING PHYSICIAN: Dr. Mayford Knife  CHIEF COMPLAINT:   Chief Complaint  Patient presents with  . Seizures    HISTORY OF PRESENT ILLNESS:  Christy Mcbride  is a 55 y.o. female with a known history of DM, Chronic pain, Anxiety, Hypothyroidism Here due to syncope at a local store. She remembers walking to the counter and the next thing she remembers is being moved by EMS. She felt foggy on waking up. No focal weakness or numbness. Has a week diffuse headache. No dysphagia or change in vision. There is some concern if visitors saw patient having a seizure. Although not confirmed. Patient has had recurrent falls due to weakness in her legs over the 1 year and had multiple MRIs and follow-ups with physicians. Did not lose consciousness with those episodes. No recent change in medications. Here in the emergency room patient has been found to be hypotensive into systolic of 80s with heart rate in the 50s.   PAST MEDICAL HISTORY:   Past Medical History  Diagnosis Date  . Diabetes mellitus without complication (HCC)   . Thyroid disease   . Liver disease   . Herniated cervical disc   . Hypertension   . Allergy   . Sleep apnea     PAST SURGICAL HISTORY:   Past Surgical History  Procedure Laterality Date  . Ablation    . Cholecystectomy    . Cesarean section    . Liver biopsy    . Esophagogastroduodenoscopy (egd) with propofol N/A 09/14/2015    Procedure: ESOPHAGOGASTRODUODENOSCOPY (EGD) WITH PROPOFOL;  Surgeon: Wallace Cullens, MD;  Location: [facility] ENDOSCOPY;  Service: Endoscopy;  Laterality: N/A;    SOCIAL HISTORY:   Social History  Substance Use Topics  . Smoking status: Former Smoker    Types: Cigarettes  . Smokeless tobacco: Not on file  . Alcohol Use: 0.0  oz/week    0 Standard drinks or equivalent per week     Comment: occas.    FAMILY HISTORY:   Family History  Problem Relation Age of Onset  . Hypertension Mother   . Stroke Mother   . Diabetes Father   . Heart disease Father     DRUG ALLERGIES:   Allergies  Allergen Reactions  . Sulfa Antibiotics Anaphylaxis  . Fentanyl Itching  . Penicillins Itching and Rash    Has patient had a PCN reaction causing immediate rash, facial/tongue/throat swelling, SOB or lightheadedness with hypotension: Yes Has patient had a PCN reaction causing severe rash involving mucus membranes or skin necrosis: No Has patient had a PCN reaction that required hospitalization No Has patient had a PCN reaction occurring within the last 10 years: Yes If all of the above answers are "NO", then may proceed with Cephalosporin use.  . Topiramate Itching    Hands and feet itching    REVIEW OF SYSTEMS:   Review of Systems  Constitutional: Negative for fever, chills, weight loss and malaise/fatigue.  HENT: Negative for hearing loss and nosebleeds.   Eyes: Negative for blurred vision, double vision and pain.  Respiratory: Negative for cough, hemoptysis, sputum production, shortness of breath and wheezing.   Cardiovascular: Negative for chest pain, palpitations, orthopnea and leg swelling.  Gastrointestinal: Negative for nausea, vomiting, abdominal pain, diarrhea and constipation.  Genitourinary: Negative for dysuria and hematuria.  Musculoskeletal: Positive for back pain and joint pain. Negative for myalgias and falls.  Skin: Negative for rash.  Neurological: Positive for loss of consciousness. Negative for dizziness, tremors, sensory change, speech change, focal weakness, seizures and headaches.  Endo/Heme/Allergies: Does not bruise/bleed easily.  Psychiatric/Behavioral: Negative for depression and memory loss. The patient is not nervous/anxious.     MEDICATIONS AT HOME:   Prior to Admission medications    Medication Sig Start Date End Date Taking? Authorizing Provider  LINZESS 145 MCG CAPS capsule Take 145 mcg by mouth daily. 10/01/15  Yes Historical Provider, MD  cyclobenzaprine (FLEXERIL) 10 MG tablet Take 1 tablet (10 mg total) by mouth every 8 (eight) hours as needed for muscle spasms. 07/28/15   Charmayne Sheerharles M Beers, PA-C  diazepam (VALIUM) 5 MG tablet Take 5 mg by mouth every 12 (twelve) hours as needed for anxiety.    Historical Provider, MD  ERGOCALCIFEROL PO Take 1.25 mg by mouth once a week.    Historical Provider, MD  HYDROcodone-acetaminophen (NORCO/VICODIN) 5-325 MG tablet Take 1 tablet by mouth every 4 (four) hours as needed for moderate pain. 10/07/15   Minna AntisKevin Paduchowski, MD  metFORMIN (GLUMETZA) 500 MG (MOD) 24 hr tablet Take 500 mg by mouth daily with breakfast.    Historical Provider, MD  pantoprazole (PROTONIX) 40 MG tablet Take 1 tablet (40 mg total) by mouth 2 (two) times daily. 09/14/15   Wyatt Hasteavid K Hower, MD  PARoxetine (PAXIL) 40 MG tablet Take 40 mg by mouth daily.    Historical Provider, MD  rOPINIRole (REQUIP) 1 MG tablet Take 1 mg by mouth at bedtime.    Historical Provider, MD  simethicone (GAS-X) 80 MG chewable tablet Chew 1 tablet (80 mg total) by mouth every 6 (six) hours as needed for flatulence. 09/14/15   Wyatt Hasteavid K Hower, MD  tiZANidine (ZANAFLEX) 4 MG capsule Take 1 capsule by mouth 4 (four) times daily as needed.    Historical Provider, MD     VITAL SIGNS:  Blood pressure 100/63, pulse 52, temperature 98.2 F (36.8 C), temperature source Oral, resp. rate 13, height 5\' 2"  (1.575 m), weight 68.04 kg (150 lb), SpO2 96 %.  PHYSICAL EXAMINATION:  Physical Exam  GENERAL:  55 y.o.-year-old patient lying in the bed with no acute distress.  EYES: Pupils equal, round, reactive to light and accommodation. No scleral icterus. Extraocular muscles intact.  HEENT: Head atraumatic, normocephalic. Oropharynx and nasopharynx clear. No oropharyngeal erythema, moist oral mucosa  NECK:   Supple, no jugular venous distention. No thyroid enlargement, no tenderness.  LUNGS: Normal breath sounds bilaterally, no wheezing, rales, rhonchi. No use of accessory muscles of respiration.  CARDIOVASCULAR: S1, S2 normal. No murmurs, rubs, or gallops.  ABDOMEN: Soft, nontender, nondistended. Bowel sounds present. No organomegaly or mass.  EXTREMITIES: No pedal edema, cyanosis, or clubbing. + 2 pedal & radial pulses b/l.   NEUROLOGIC: Cranial nerves II through XII are intact. No focal Motor or sensory deficits appreciated b/l PSYCHIATRIC: The patient is alert and oriented x 3. Good affect.  SKIN: No obvious rash, lesion, or ulcer.   LABORATORY PANEL:   CBC  Recent Labs Lab 10/19/15 1022  WBC 7.4  HGB 12.3  HCT 37.2  PLT 252   ------------------------------------------------------------------------------------------------------------------  Chemistries   Recent Labs Lab 10/19/15 1022  NA 136  K 4.0  CL 101  CO2 26  GLUCOSE 122*  BUN 10  CREATININE 0.82  CALCIUM 9.5  AST 54*  ALT 40  ALKPHOS 83  BILITOT 0.7   ------------------------------------------------------------------------------------------------------------------  Cardiac Enzymes No results for input(s): TROPONINI in the last 168 hours. ------------------------------------------------------------------------------------------------------------------  RADIOLOGY:  No results found.   IMPRESSION AND PLAN:   * Syncope This is likely due to dehydration or medications. Patient does have mild bradycardia but not significant. Bolus normal saline .We will rehydrate. Stop diazepam. Telemetry. Check echocardiogram. Carotid Dopplers not needed. Some concern if patient had seizure. We'll check an MRI of the brain. Consult neurology for further input. No seizure medications at this point. Seizure precautions. TSH is normal. Blood glucose normal  * Diabetes mellitus Continue home medications and sliding scale  insulin  * DVT prophylaxis with Lovenox   All the records are reviewed and case discussed with ED provider. Management plans discussed with the patient, family and they are in agreement.  CODE STATUS: FULL CODE  TOTAL TIME TAKING CARE OF THIS PATIENT: 40 minutes.   Milagros Loll R M.D on 10/19/2015 at 12:56 PM  Between 7am to 6pm - Pager - [phone]  After 6pm go to [email or web address] - password [email or web address] [facility]  [facility]  Office  [phone]  CC: Primary care physician; Leanna Sato, MD  Note: This dictation was prepared with Dragon dictation along with smaller phrase technology. Any transcriptional errors that result from this process are unintentional.

## 2015-10-19 NOTE — ED Notes (Signed)
Seizure pads placed in bed, yellow socks placed on patient, and fall pads placed around the bed with yellow fall risk signs in the hall and yellow fall risk bracelet.

## 2015-10-19 NOTE — ED Notes (Signed)
Pt presents to ED via [facility] with c/o seizure like activity. Per EMS pt was at the tanning salon when she began to have seizure like activity. Pt does not remember the fall at this time. Per EMS pt was being seen by PCP and was to be referred to a neurologist for seizures, pt does not have official dx of seizures. Pt presents with c/o pain to the top and back of her head, pt presents alert and oriented x4.

## 2015-10-20 ENCOUNTER — Observation Stay (HOSPITAL_BASED_OUTPATIENT_CLINIC_OR_DEPARTMENT_OTHER)
Admit: 2015-10-20 | Discharge: 2015-10-20 | Disposition: A | Discharge status: Home / Self Care | Payer: BLUE CROSS/BLUE SHIELD | Attending: Internal Medicine | Admitting: Internal Medicine

## 2015-10-20 DIAGNOSIS — F488 Other specified nonpsychotic mental disorders: Secondary | ICD-10-CM

## 2015-10-20 DIAGNOSIS — R55 Syncope and collapse: Secondary | ICD-10-CM

## 2015-10-20 LAB — CBC
HCT: 38 % (ref 35.0–47.0)
Hemoglobin: 12.6 g/dL (ref 12.0–16.0)
MCH: 26.3 pg (ref 26.0–34.0)
MCHC: 33 g/dL (ref 32.0–36.0)
MCV: 79.5 fL — ABNORMAL LOW (ref 80.0–100.0)
PLATELETS: 213 10*3/uL (ref 150–440)
RBC: 4.78 MIL/uL (ref 3.80–5.20)
RDW: 13.9 % (ref 11.5–14.5)
WBC: 5.7 10*3/uL (ref 3.6–11.0)

## 2015-10-20 LAB — BASIC METABOLIC PANEL
Anion gap: 7 (ref 5–15)
BUN: 9 mg/dL (ref 6–20)
CALCIUM: 9 mg/dL (ref 8.9–10.3)
CO2: 29 mmol/L (ref 22–32)
CREATININE: 0.76 mg/dL (ref 0.44–1.00)
Chloride: 106 mmol/L (ref 101–111)
GFR calc non Af Amer: >60 mL/min (ref >60)
GLUCOSE: 113 mg/dL — AB (ref 65–99)
Potassium: 3.7 mmol/L (ref 3.5–5.1)
Sodium: 142 mmol/L (ref 135–145)

## 2015-10-20 LAB — GLUCOSE, CAPILLARY: GLUCOSE-CAPILLARY: 142 mg/dL — AB (ref 65–99)

## 2015-10-20 LAB — ECHOCARDIOGRAM COMPLETE
HEIGHTINCHES: 62 in
WEIGHTICAEL: 2478.4 [oz_av]

## 2015-10-20 NOTE — Discharge Summary (Signed)
Christy Mcbride, 55 y.o., DOB [birth year], MRN [id number]. Admission date: 10/19/2015 Discharge Date 10/20/2015 Primary MD Leanna Sato, MD Admitting Physician Milagros Loll, MD  Admission Diagnosis  Syncope and collapse [R55]  Discharge Diagnosis   Active Problems:   Syncope   Diabetes type 2  Chronic pain Anxiety Hypothyroidism         Hospital Course  Christy Mcbride is an 55 y.o. female with a known history of DM, Chronic pain, Anxiety, Hypothyroidism Here due to syncope at a local store. She remembers walking to the counter and the next thing she remembers is being moved by EMS.Patient has had recurrent falls due to weakness in her legs over the 1 year and had multiple MRIs and follow-ups with physicians. Patient was noted to be hypotensive in the ED given IV fluids. There was some question about a seizure. She was seen by neurology and they did not feel that she had a seizure. Patient's blood pressure is actually elevated today. Unclear cause for the low blood pressure. She is feeling much better echocardiogram shows normal systolic function. He is feeling          Consults  neurology  Significant Tests:  See full reports for all details    Ct Head Wo Contrast  10/07/2015  CLINICAL DATA:  Larey Seat 4 times yesterday. Fell backwards into bathtub. Syncopal episode with the last fall. Persistent dizziness. Left arm pain. EXAM: CT HEAD WITHOUT CONTRAST CT CERVICAL SPINE WITHOUT CONTRAST TECHNIQUE: Multidetector CT imaging of the head and cervical spine was performed following the standard protocol without intravenous contrast. Multiplanar CT image reconstructions of the cervical spine were also generated. COMPARISON:  CT head without contrast 04/08/2013. MRI of the cervical spine 05/31/2015. FINDINGS: CT HEAD FINDINGS No acute cortical infarct, hemorrhage, or mass lesion is present. The ventricles are of normal size. No significant extra-axial fluid collection is evident. The paranasal sinuses  and mastoid air cells are clear. The calvarium is intact. No significant extracranial soft tissue injury is evident. The globes and orbits are intact. CT CERVICAL SPINE FINDINGS Cervical spine is imaged from the skullbase through T2-3. Vertebral body heights alignment are maintained. 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 No acute fracture or traumatic subluxation is evident. Endplate and uncovertebral spurring is present at C4-5, C5-6, and C6-7. Vertebral body heights alignment are maintained. Facet hypertrophy is present on the right at C4-5. The thyroid is heterogeneous. In inferiorly directed nodule from the isthmus measures 2.2 cm. Calcifications are present at the aortic arch. The visualized lung apices demonstrate mild atelectasis on the right. IMPRESSION: 1. Normal CT of the head. 2. Mild multilevel spondylosis of the cervical spine. 3. 2.2 cm inferiorly directed nodule from the isthmus of the thyroid. Recommend thyroid ultrasound and possible biopsy for further evaluation. Electronically Signed   By: Marin Roberts M.D.   On: 10/07/2015 17:13   Ct Cervical Spine Wo Contrast  10/07/2015  CLINICAL DATA:  Larey Seat 4 times yesterday. Fell backwards into bathtub. Syncopal episode with the last fall. Persistent dizziness. Left arm pain. EXAM: CT HEAD WITHOUT CONTRAST CT CERVICAL SPINE WITHOUT CONTRAST TECHNIQUE: Multidetector CT imaging of the head and cervical spine was performed following the standard protocol without intravenous contrast. Multiplanar CT image reconstructions of the cervical spine were also generated. COMPARISON:  CT head without contrast 04/08/2013. MRI of the cervical spine 05/31/2015. FINDINGS: CT HEAD FINDINGS No acute cortical infarct, hemorrhage, or mass lesion is present. The ventricles are of normal size. No significant  extra-axial fluid collection is evident. The paranasal sinuses and mastoid air cells are clear. The calvarium is intact. No significant  extracranial soft tissue injury is evident. The globes and orbits are intact. CT CERVICAL SPINE FINDINGS Cervical spine is imaged from the skullbase through T2-3. Vertebral body heights alignment are maintained. 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 No acute fracture or traumatic subluxation is evident. Endplate and uncovertebral spurring is present at C4-5, C5-6, and C6-7. Vertebral body heights alignment are maintained. Facet hypertrophy is present on the right at C4-5. The thyroid is heterogeneous. In inferiorly directed nodule from the isthmus measures 2.2 cm. Calcifications are present at the aortic arch. The visualized lung apices demonstrate mild atelectasis on the right. IMPRESSION: 1. Normal CT of the head. 2. Mild multilevel spondylosis of the cervical spine. 3. 2.2 cm inferiorly directed nodule from the isthmus of the thyroid. Recommend thyroid ultrasound and possible biopsy for further evaluation. Electronically Signed   By: Marin Roberts M.D.   On: 10/07/2015 17:13       Today   Subjective:   Christy Mcbride  Feels well anxious to go home  Objective:   Blood pressure 166/75, pulse 92, temperature 98.4 F (36.9 C), temperature source Oral, resp. rate 18, height  (1.575 m), weight 70.262 kg (154 lb 14.4 oz), SpO2 97 %.  .  Intake/Output Summary (Last 24 hours) at 10/20/15 1421 Last data filed at 10/20/15 1100  Gross per 24 hour  Intake    360 ml  Output   2300 ml  Net  -1940 ml    Exam VITAL SIGNS: Blood pressure 166/75, pulse 92, temperature 98.4 F (36.9 C), temperature source Oral, resp. rate 18, height  (1.575 m), weight 70.262 kg (154 lb 14.4 oz), SpO2 97 %.  GENERAL:  55 y.o.-year-old patient lying in the bed with no acute distress.  EYES: Pupils equal, round, reactive to light and accommodation. No scleral icterus. Extraocular muscles intact.  HEENT: Head atraumatic, normocephalic. Oropharynx and nasopharynx clear.  NECK:   Supple, no jugular venous distention. No thyroid enlargement, no tenderness.  LUNGS: Normal breath sounds bilaterally, no wheezing, rales,rhonchi or crepitation. No use of accessory muscles of respiration.  CARDIOVASCULAR: S1, S2 normal. No murmurs, rubs, or gallops.  ABDOMEN: Soft, nontender, nondistended. Bowel sounds present. No organomegaly or mass.  EXTREMITIES: No pedal edema, cyanosis, or clubbing.  NEUROLOGIC: Cranial nerves II through XII are intact. Muscle strength 5/5 in all extremities. Sensation intact. Gait not checked.  PSYCHIATRIC: The patient is alert and oriented x 3.  SKIN: No obvious rash, lesion, or ulcer.   Data Review     CBC w Diff: Lab Results  Component Value Date   WBC 5.7 10/20/2015   WBC 7.2 04/28/2014   HGB 12.6 10/20/2015   HGB 14.0 04/28/2014   HCT 38.0 10/20/2015   HCT 43.7 04/28/2014   PLT 213 10/20/2015   PLT 307 04/28/2014   LYMPHOPCT 28 10/19/2015   LYMPHOPCT 25.9 03/24/2014   MONOPCT 11 10/19/2015   MONOPCT 8.2 03/24/2014   EOSPCT 4 10/19/2015   EOSPCT 2.1 03/24/2014   BASOPCT 1 10/19/2015   BASOPCT 0.7 03/24/2014   CMP: Lab Results  Component Value Date   NA 142 10/20/2015   NA 141 04/28/2014   K 3.7 10/20/2015   K 3.6 04/28/2014   CL 106 10/20/2015   CL 109* 04/28/2014   CO2 29 10/20/2015   CO2 27 04/28/2014   BUN 9 10/20/2015  BUN 7 04/28/2014   CREATININE 0.76 10/20/2015   CREATININE 0.79 04/28/2014   PROT 7.2 10/19/2015   PROT 6.9 04/28/2014   ALBUMIN 4.2 10/19/2015   ALBUMIN 3.6 04/28/2014   BILITOT 0.7 10/19/2015   BILITOT 0.3 04/28/2014   ALKPHOS 83 10/19/2015   ALKPHOS 102 04/28/2014   AST 54* 10/19/2015   AST 18 04/28/2014   ALT 40 10/19/2015   ALT 35 04/28/2014  .  Micro Results No results found for this or any previous visit (from the past 240 hour(s)).      Code Status Orders        Start     Ordered   10/19/15 1256  Full code   Continuous     10/19/15 1256    Code Status History    Date  Active Date Inactive Code Status Order ID Comments User Context   09/13/2015  2:09 AM 09/14/2015  6:03 PM Full Code [id number]  Enedina FinnerSona Kyliah Deanda, MD Inpatient          Follow-up Information    Follow up with Leanna SatoMILES,LINDA M, MD In 7 days.   Specialty:  Family Medicine   Contact information:   [address] [place] [id number] [phone]       Discharge Medications     Medication List    TAKE these medications        cyclobenzaprine 10 MG tablet  Commonly known as:  FLEXERIL  Take 1 tablet (10 mg total) by mouth every 8 (eight) hours as needed for muscle spasms.     diazepam 5 MG tablet  Commonly known as:  VALIUM  Take 5 mg by mouth every 12 (twelve) hours as needed for anxiety.     HYDROcodone-acetaminophen 5-325 MG tablet  Commonly known as:  NORCO/VICODIN  Take 1 tablet by mouth every 4 (four) hours as needed for moderate pain.     LINZESS 145 MCG Caps capsule  Generic drug:  linaclotide  Take 145 mcg by mouth daily.     metFORMIN 500 MG (MOD) 24 hr tablet  Commonly known as:  GLUMETZA  Take 500 mg by mouth daily with breakfast.     pantoprazole 40 MG tablet  Commonly known as:  PROTONIX  Take 1 tablet (40 mg total) by mouth 2 (two) times daily.     PARoxetine 40 MG tablet  Commonly known as:  PAXIL  Take 40 mg by mouth daily.     rOPINIRole 1 MG tablet  Commonly known as:  REQUIP  Take 1 mg by mouth at bedtime.     simethicone 80 MG chewable tablet  Commonly known as:  GAS-X  Chew 1 tablet (80 mg total) by mouth every 6 (six) hours as needed for flatulence.     tiZANidine 4 MG tablet  Commonly known as:  ZANAFLEX  Take 4 mg by mouth every 6 (six) hours as needed for muscle spasms.           Total Time in preparing paper work, data evaluation and todays exam - 35 minutes  Auburn BilberryPATEL, Jaydee Conran M.D on 10/20/2015 at 2:21 [facility]  [facility]   Office  [phone]

## 2015-10-20 NOTE — Progress Notes (Signed)
*  PRELIMINARY RESULTS* Echocardiogram 2D Echocardiogram has been performed.  Christy Mcbride 10/20/2015, 9:06 AM

## 2015-10-20 NOTE — Care Management Note (Signed)
Case Management Note  Patient Details  Name: Christy Mcbride MRN: [id number] Date of Birth: 1961/01/02  Subjective/Objective:     Already has an outpatient referral to a neurologist per falls. No home health needs. Apparently has been having occasional syncopal episodes.                Action/Plan:   Expected Discharge Date:                  Expected Discharge Plan:     In-House Referral:     Discharge planning Services     Post Acute Care Choice:    Choice offered to:     DME Arranged:    DME Agency:     HH Arranged:    HH Agency:     Status of Service:     Medicare Important Message Given:    Date Medicare IM Given:    Medicare IM give by:    Date Additional Medicare IM Given:    Additional Medicare Important Message give by:     If discussed at Long Length of Stay Meetings, dates discussed:    Additional Comments:  Olegario Emberson A, RN 10/20/2015, 11:08 AM

## 2015-10-20 NOTE — Consult Note (Signed)
CC: syncope   HPI: Christy Mcbride is an 55 y.o. female with a known history of DM, Chronic pain, Anxiety, Hypothyroidism Here due to syncope at a local store. She remembers walking to the counter and the next thing she remembers is being moved by EMS.Patient has had recurrent falls due to weakness in her legs over the 1 year and had multiple MRIs and follow-ups with physicians. Did not lose consciousness with those episodes. No recent change in medications. Here in the emergency room patient has been found to be hypotensive into systolic of 80s with heart rate in the 50s.  On questioning pt does state that she felt like she was shaking all her extremities.    Past Medical History  Diagnosis Date  . Diabetes mellitus without complication (HCC)   . Thyroid disease   . Liver disease   . Herniated cervical disc   . Hypertension   . Allergy   . Sleep apnea     Past Surgical History  Procedure Laterality Date  . Ablation    . Cholecystectomy    . Cesarean section    . Liver biopsy    . Esophagogastroduodenoscopy (egd) with propofol N/A 09/14/2015    Procedure: ESOPHAGOGASTRODUODENOSCOPY (EGD) WITH PROPOFOL;  Surgeon: Wallace CullensPaul Y Oh, MD;  [facility];  Service: Endoscopy;  Laterality: N/A;    Family History  Problem Relation Age of Onset  . Hypertension Mother   . Stroke Mother   . Diabetes Father   . Heart disease Father     Social History:  reports that she has quit smoking. Her smoking use included Cigarettes. She does not have any smokeless tobacco history on file. She reports that she drinks alcohol. She reports that she does not use illicit drugs.  Allergies  Allergen Reactions  . Sulfa Antibiotics Anaphylaxis  . Fentanyl Itching  . Penicillins Itching and Rash    Has patient had a PCN reaction causing immediate rash, facial/tongue/throat swelling, SOB or lightheadedness with hypotension: Yes Has patient had a PCN reaction causing severe rash involving mucus  membranes or skin necrosis: No Has patient had a PCN reaction that required hospitalization No Has patient had a PCN reaction occurring within the last 10 years: Yes If all of the above answers are "NO", then may proceed with Cephalosporin use.  . Topiramate Itching    Hands and feet itching    Medications: I have reviewed the patient's current medications.  ROS: History obtained from the patient  General ROS: negative for - chills, fatigue, fever, night sweats, weight gain or weight loss Psychological ROS: negative for - behavioral disorder, hallucinations, memory difficulties, mood swings or suicidal ideation Ophthalmic ROS: negative for - blurry vision, double vision, eye pain or loss of vision ENT ROS: negative for - epistaxis, nasal discharge, oral lesions, sore throat, tinnitus or vertigo Allergy and Immunology ROS: negative for - hives or itchy/watery eyes Hematological and Lymphatic ROS: negative for - bleeding problems, bruising or swollen lymph nodes Endocrine ROS: negative for - galactorrhea, hair pattern changes, polydipsia/polyuria or temperature intolerance Respiratory ROS: negative for - cough, hemoptysis, shortness of breath or wheezing Cardiovascular ROS: negative for - chest pain, dyspnea on exertion, edema or irregular heartbeat Gastrointestinal ROS: negative for - abdominal pain, diarrhea, hematemesis, nausea/vomiting or stool incontinence Genito-Urinary ROS: negative for - dysuria, hematuria, incontinence or urinary frequency/urgency Musculoskeletal ROS: negative for - joint swelling or muscular weakness Neurological ROS: as noted in HPI Dermatological ROS: negative for rash and skin lesion changes  Physical Examination: Blood pressure 166/75, pulse 92, temperature 98.4 F (36.9 C), temperature source Oral, resp. rate 18, height 5\' 2"  (1.575 m), weight 70.262 kg (154 lb 14.4 oz), SpO2 97 %.    Neurological Examination Mental Status: Alert, oriented, thought  content appropriate.  Speech fluent without evidence of aphasia.  Able to follow 3 step commands without difficulty. Cranial Nerves: II: Discs flat bilaterally; Visual fields grossly normal, pupils equal, round, reactive to light and accommodation III,IV, VI: ptosis not present, extra-ocular motions intact bilaterally V,VII: smile symmetric, facial light touch sensation normal bilaterally VIII: hearing normal bilaterally IX,X: gag reflex present XI: bilateral shoulder shrug XII: midline tongue extension Motor: Right : Upper extremity   5/5    Left:     Upper extremity   5/5  Lower extremity   5/5     Lower extremity   5/5 Tone and bulk:normal tone throughout; no atrophy noted Sensory: Pinprick and light touch intact throughout, bilaterally Deep Tendon Reflexes: 2+ and symmetric throughout Plantars: Right: downgoing   Left: downgoing Cerebellar: normal finger-to-nose, normal rapid alternating movements and normal heel-to-shin test Gait: normal gait and station      Laboratory Studies:   Basic Metabolic Panel:  Recent Labs Lab 10/19/15 1022 10/20/15 0425  NA 136 142  K 4.0 3.7  CL 101 106  CO2 26 29  GLUCOSE 122* 113*  BUN 10 9  CREATININE 0.82 0.76  CALCIUM 9.5 9.0    Liver Function Tests:  Recent Labs Lab 10/19/15 1022  AST 54*  ALT 40  ALKPHOS 83  BILITOT 0.7  PROT 7.2  ALBUMIN 4.2   No results for input(s): LIPASE, AMYLASE in the last 168 hours. No results for input(s): AMMONIA in the last 168 hours.  CBC:  Recent Labs Lab 10/19/15 1022 10/20/15 0425  WBC 7.4 5.7  NEUTROABS 4.1  --   HGB 12.3 12.6  HCT 37.2 38.0  MCV 78.8* 79.5*  PLT 252 213    Cardiac Enzymes:  Recent Labs Lab 10/19/15 1615 10/19/15 2232  TROPONINI <0.03 <0.03    BNP: Invalid input(s): POCBNP  CBG:  Recent Labs Lab 10/19/15 1625 10/19/15 2056 10/20/15 0733  GLUCAP 114* 101* 142*    Microbiology: Results for orders placed or performed during the hospital  encounter of 02/19/15  Wet prep, genital     Status: Abnormal   Collection Time: 02/19/15  6:31 PM  Result Value Ref Range Status   Yeast Wet Prep HPF POC NONE SEEN NONE SEEN Final   Trich, Wet Prep NONE SEEN NONE SEEN Final   Clue Cells Wet Prep HPF POC NONE SEEN NONE SEEN Final   WBC, Wet Prep HPF POC MODERATE (A) NONE SEEN Final    Comment: MODERATE SPERM  Chlamydia/NGC rt PCR ([facility] only)     Status: None   Collection Time: 02/19/15  6:31 PM  Result Value Ref Range Status   Specimen source GC/Chlam VAGINA  Final   Chlamydia Tr NOT DETECTED NOT DETECTED Final   N gonorrhoeae NOT DETECTED NOT DETECTED Final    Comment: (NOTE) 100  This methodology has not been evaluated in pregnant women or in 200  patients with a history of hysterectomy. 300 400  This methodology will not be performed on patients less than 52  years of age.     Coagulation Studies: No results for input(s): LABPROT, INR in the last 72 hours.  Urinalysis: No results for input(s): COLORURINE, LABSPEC, PHURINE, GLUCOSEU, HGBUR, BILIRUBINUR, KETONESUR, PROTEINUR, UROBILINOGEN,  NITRITE, LEUKOCYTESUR in the last 168 hours.  Invalid input(s): APPERANCEUR  Lipid Panel:  No results found for: CHOL, TRIG, HDL, CHOLHDL, VLDL, LDLCALC  HgbA1C:  Lab Results  Component Value Date   HGBA1C 6.8* 10/19/2015    Urine Drug Screen:     Component Value Date/Time   LABOPIA NONE DETECTED 10/19/2015 1005   COCAINSCRNUR NONE DETECTED 10/19/2015 1005   LABBENZ POSITIVE* 10/19/2015 1005   AMPHETMU NONE DETECTED 10/19/2015 1005   THCU NONE DETECTED 10/19/2015 1005   LABBARB NONE DETECTED 10/19/2015 1005    Alcohol Level:  Recent Labs Lab 10/19/15 1022  ETH <5     Imaging: No results found.   Assessment/Plan:  55 y.o. female with a known history of DM, Chronic pain, Anxiety, Hypothyroidism Here due to syncope at a local store. She remembers walking to the counter and the next thing she remembers is being moved by  EMS.Patient has had recurrent falls due to weakness in her legs over the 1 year and had multiple MRIs and follow-ups with physicians. Did not lose consciousness with those episodes. No recent change in medications. Here in the emergency room patient has been found to be hypotensive into systolic of 80s with heart rate in the 50s.  On questioning pt does state that she felt like she was shaking all her extremities  - I did hyperventilate the pt for 1.5 minutes and she has similar symptoms - Do not think this is seizure - No anti epileptics  - no further imaging from neuro stand point - d/c planning [place]  10/20/2015, 1:20 PM

## 2015-10-20 NOTE — Progress Notes (Signed)
Patient given discharge teaching and paperwork regarding medications, diet, follow-up appointments and activity. Patient understanding verbalized. No complaints at this time. IV and telemetry discontinued prior to leaving. Skin assessment as previously charted and vitals are stable; on room air. Patient being discharged to home. Caregiver/family present during discharge teaching. No new prescriptions. Declined to be taken out in a wheelchair - walked out by NT.

## 2015-10-20 NOTE — Discharge Instructions (Signed)
°  DIET:  Diabetic diet  DISCHARGE CONDITION:  Good  ACTIVITY:  Activity as tolerated  OXYGEN:  Home Oxygen: No.   Oxygen Delivery: room air  DISCHARGE LOCATION:  home    ADDITIONAL DISCHARGE INSTRUCTION:   If you experience worsening of your admission symptoms, develop shortness of breath, life threatening emergency, suicidal or homicidal thoughts you must seek medical attention immediately by calling 911 or calling your MD immediately  if symptoms less severe.  You Must read complete instructions/literature along with all the possible adverse reactions/side effects for all the Medicines you take and that have been prescribed to you. Take any new Medicines after you have completely understood and accpet all the possible adverse reactions/side effects.   Please note  You were cared for by a hospitalist during your hospital stay. If you have any questions about your discharge medications or the care you received while you were in the hospital after you are discharged, you can call the unit and asked to speak with the hospitalist on call if the hospitalist that took care of you is not available. Once you are discharged, your primary care physician will handle any further medical issues. Please note that NO REFILLS for any discharge medications will be authorized once you are discharged, as it is imperative that you return to your primary care physician (or establish a relationship with a primary care physician if you do not have one) for your aftercare needs so that they can reassess your need for medications and monitor your lab values.

## 2015-10-20 NOTE — Progress Notes (Signed)
Patient requesting pain medications since tylenol didn't help much. Spoke with Dr. Imogene Burnhen, he does not want to start anything new at this time. MD to round shortly. Will continue to monitor.

## 2015-11-05 ENCOUNTER — Emergency Department: Payer: BLUE CROSS/BLUE SHIELD

## 2015-11-05 ENCOUNTER — Emergency Department
Admission: EM | Admit: 2015-11-05 | Discharge: 2015-11-06 | Disposition: A | Discharge status: Home / Self Care | Payer: BLUE CROSS/BLUE SHIELD | Attending: Emergency Medicine | Admitting: Emergency Medicine

## 2015-11-05 ENCOUNTER — Encounter: Payer: Self-pay | Admitting: Emergency Medicine

## 2015-11-05 DIAGNOSIS — Z87891 Personal history of nicotine dependence: Secondary | ICD-10-CM | POA: Diagnosis not present

## 2015-11-05 DIAGNOSIS — E860 Dehydration: Secondary | ICD-10-CM | POA: Insufficient documentation

## 2015-11-05 DIAGNOSIS — R739 Hyperglycemia, unspecified: Secondary | ICD-10-CM

## 2015-11-05 DIAGNOSIS — I10 Essential (primary) hypertension: Secondary | ICD-10-CM | POA: Diagnosis not present

## 2015-11-05 DIAGNOSIS — R52 Pain, unspecified: Secondary | ICD-10-CM | POA: Diagnosis not present

## 2015-11-05 DIAGNOSIS — Z79899 Other long term (current) drug therapy: Secondary | ICD-10-CM | POA: Insufficient documentation

## 2015-11-05 DIAGNOSIS — R42 Dizziness and giddiness: Secondary | ICD-10-CM | POA: Insufficient documentation

## 2015-11-05 DIAGNOSIS — M5136 Other intervertebral disc degeneration, lumbar region: Secondary | ICD-10-CM | POA: Insufficient documentation

## 2015-11-05 DIAGNOSIS — Z7984 Long term (current) use of oral hypoglycemic drugs: Secondary | ICD-10-CM | POA: Diagnosis not present

## 2015-11-05 DIAGNOSIS — E1165 Type 2 diabetes mellitus with hyperglycemia: Secondary | ICD-10-CM | POA: Diagnosis present

## 2015-11-05 LAB — CBC
HEMATOCRIT: 44.4 % (ref 35.0–47.0)
Hemoglobin: 14.9 g/dL (ref 12.0–16.0)
MCH: 26 pg (ref 26.0–34.0)
MCHC: 33.6 g/dL (ref 32.0–36.0)
MCV: 77.6 fL — ABNORMAL LOW (ref 80.0–100.0)
Platelets: 313 10*3/uL (ref 150–440)
RBC: 5.72 MIL/uL — AB (ref 3.80–5.20)
RDW: 14.3 % (ref 11.5–14.5)
WBC: 10.9 10*3/uL (ref 3.6–11.0)

## 2015-11-05 LAB — BASIC METABOLIC PANEL
ANION GAP: 13 (ref 5–15)
BUN: 17 mg/dL (ref 6–20)
CHLORIDE: 103 mmol/L (ref 101–111)
CO2: 24 mmol/L (ref 22–32)
Calcium: 9.6 mg/dL (ref 8.9–10.3)
Creatinine, Ser: 1.05 mg/dL — ABNORMAL HIGH (ref 0.44–1.00)
GFR, EST NON AFRICAN AMERICAN: 59 mL/min — AB (ref >60)
Glucose, Bld: 174 mg/dL — ABNORMAL HIGH (ref 65–99)
POTASSIUM: 3.6 mmol/L (ref 3.5–5.1)
SODIUM: 140 mmol/L (ref 135–145)

## 2015-11-05 LAB — URINALYSIS COMPLETE WITH MICROSCOPIC (ARMC ONLY)
BACTERIA UA: NONE SEEN
Bilirubin Urine: NEGATIVE
GLUCOSE, UA: NEGATIVE mg/dL
KETONES UR: NEGATIVE mg/dL
NITRITE: NEGATIVE
Protein, ur: 30 mg/dL — AB
SPECIFIC GRAVITY, URINE: 1.015 (ref 1.005–1.030)
pH: 5 (ref 5.0–8.0)

## 2015-11-05 LAB — GLUCOSE, CAPILLARY: GLUCOSE-CAPILLARY: 169 mg/dL — AB (ref 65–99)

## 2015-11-05 MED ORDER — ONDANSETRON 4 MG PO TBDP
4.0000 mg | ORAL_TABLET | Freq: Once | ORAL | Status: AC
  Administered 2015-11-05: 4 mg via ORAL

## 2015-11-05 MED ORDER — KETOROLAC TROMETHAMINE 30 MG/ML IJ SOLN
30.0000 mg | Freq: Once | INTRAMUSCULAR | Status: AC
  Administered 2015-11-06: 30 mg via INTRAVENOUS
  Filled 2015-11-05: qty 1

## 2015-11-05 MED ORDER — ONDANSETRON 4 MG PO TBDP
ORAL_TABLET | ORAL | Status: AC
  Filled 2015-11-05: qty 1

## 2015-11-05 MED ORDER — SODIUM CHLORIDE 0.9 % IV BOLUS (SEPSIS)
1000.0000 mL | Freq: Once | INTRAVENOUS | Status: AC
  Administered 2015-11-06: 1000 mL via INTRAVENOUS

## 2015-11-05 MED ORDER — ONDANSETRON HCL 4 MG PO TABS: 4.0000 mg | ORAL_TABLET | Freq: Once | ORAL | Status: DC

## 2015-11-05 MED ORDER — GI COCKTAIL ~~LOC~~
30.0000 mL | Freq: Once | ORAL | Status: AC
  Administered 2015-11-06: 30 mL via ORAL
  Filled 2015-11-05: qty 30

## 2015-11-05 NOTE — ED Notes (Addendum)
Patient ambulated to triage with steady gait. Patient presents to ED with c/o hyperglycemia since Saturday, reports"I checked my sugar and my machine said is was 800 and something yesterday". Patient takes Metformin, reports has been taking prescribed meds. Patient also c/o nausea and vomiting, increase in urinary frequency. Patient denies chest pain or shortness of breath. Patient a&o x 3, respirations even and unlabored, skin warm and dry.

## 2015-11-06 ENCOUNTER — Emergency Department: Payer: BLUE CROSS/BLUE SHIELD

## 2015-11-06 LAB — GLUCOSE, CAPILLARY: Glucose-Capillary: 136 mg/dL — ABNORMAL HIGH (ref 65–99)

## 2015-11-06 LAB — TROPONIN I: Troponin I: <0.03 ng/mL (ref <0.031)

## 2015-11-06 MED ORDER — SODIUM CHLORIDE 0.9 % IV BOLUS (SEPSIS)
1000.0000 mL | Freq: Once | INTRAVENOUS | Status: AC
  Administered 2015-11-06: 1000 mL via INTRAVENOUS

## 2015-11-06 NOTE — ED Provider Notes (Signed)
[facility] Emergency Department Provider Note   ____________________________________________  Time seen: Approximately 2327 PM  I have reviewed the triage vital signs and the nursing notes.   HISTORY  Chief Complaint Hyperglycemia    HPI Christy Mcbride is a 55 y.o. female who comes into the hospital today with hyperglycemia. The patient reports that she could not get her blood sugars down for the last 3 days. She reports that she measured it and it was 800 yesterday morning. She reports that when she checked it again and it was 300 and then 200. She reports that her blood sugars are in the 170s. She also reports that she fell twice, once on Sunday and once on Monday morning. She reports that she hit her head as well as her right shoulder but did not lose consciousness. She reports that since then she's hurt all over. The patient reports that she felt lightheaded after standing up and fell backwards. She rates her pain a 9 out of 10 in intensity. She did not take anything for pain at home. She's had some chills yesterday but did not take her temperature. She is not had any cough or runny nose. Her urine seemed to have an odor but has not been painful. She reports that she's also had some mild stomach pain and had a polyp removed from her stomach. She reports it is in her upper abdomen and has been hurting since about Saturday. The patient is here for evaluation and treatment of her symptoms.   Past Medical History  Diagnosis Date  . Diabetes mellitus without complication (HCC)   . Thyroid disease   . Liver disease   . Herniated cervical disc   . Hypertension   . Allergy   . Sleep apnea     Patient Active Problem List   Diagnosis Date Noted  . Syncope 10/19/2015  . GI bleed 09/13/2015  . DDD (degenerative disc disease), lumbar 03/19/2015  . Lumbar radiculopathy 03/19/2015  . Facet syndrome, lumbar 03/19/2015  . Bilateral occipital neuralgia 03/19/2015     Past Surgical History  Procedure Laterality Date  . Ablation    . Cholecystectomy    . Cesarean section    . Liver biopsy    . Esophagogastroduodenoscopy (egd) with propofol N/A 09/14/2015    Procedure: ESOPHAGOGASTRODUODENOSCOPY (EGD) WITH PROPOFOL;  Surgeon: Wallace Cullens, MD;  Location: [facility] ENDOSCOPY;  Service: Endoscopy;  Laterality: N/A;    Current Outpatient Rx  Name  Route  Sig  Dispense  Refill  . cyclobenzaprine (FLEXERIL) 10 MG tablet   Oral   Take 1 tablet (10 mg total) by mouth every 8 (eight) hours as needed for muscle spasms.   30 tablet   1   . diazepam (VALIUM) 5 MG tablet   Oral   Take 5 mg by mouth every 12 (twelve) hours as needed for anxiety.         Marland Kitchen HYDROcodone-acetaminophen (NORCO/VICODIN) 5-325 MG tablet   Oral   Take 1 tablet by mouth every 4 (four) hours as needed for moderate pain.   15 tablet   0   . LINZESS 145 MCG CAPS capsule   Oral   Take 145 mcg by mouth daily.      2     Dispense as written.   . metFORMIN (GLUMETZA) 500 MG (MOD) 24 hr tablet   Oral   Take 500 mg by mouth daily with breakfast.         .  pantoprazole (PROTONIX) 40 MG tablet   Oral   Take 1 tablet (40 mg total) by mouth 2 (two) times daily.   60 tablet   0   . PARoxetine (PAXIL) 40 MG tablet   Oral   Take 40 mg by mouth daily.         Marland Kitchen rOPINIRole (REQUIP) 1 MG tablet   Oral   Take 1 mg by mouth at bedtime.         . simethicone (GAS-X) 80 MG chewable tablet   Oral   Chew 1 tablet (80 mg total) by mouth every 6 (six) hours as needed for flatulence.   30 tablet   0   . tiZANidine (ZANAFLEX) 4 MG tablet   Oral   Take 4 mg by mouth every 6 (six) hours as needed for muscle spasms.           Allergies Sulfa antibiotics; Fentanyl; Penicillins; and Topiramate  Family History  Problem Relation Age of Onset  . Hypertension Mother   . Stroke Mother   . Diabetes Father   . Heart disease Father     Social History Social History  Substance  Use Topics  . Smoking status: Former Smoker    Types: Cigarettes  . Smokeless tobacco: None  . Alcohol Use: 0.0 oz/week    0 Standard drinks or equivalent per week     Comment: occas.    Review of Systems Constitutional: Per glycemia Eyes: No visual changes. ENT: No sore throat. Cardiovascular: Denies chest pain. Respiratory: Denies shortness of breath. Gastrointestinal:  abdominal pain.  No nausea, no vomiting.  No diarrhea.  No constipation. Genitourinary: Foul-smelling urine Musculoskeletal: Generalized body aches Skin: Negative for rash. Neurological: Negative for headaches, focal weakness or numbness.  10-point ROS otherwise negative.  ____________________________________________   PHYSICAL EXAM:  VITAL SIGNS: ED Triage Vitals  Enc Vitals Group     BP 11/05/15 2215 132/77 mmHg     Pulse Rate 11/05/15 2215 125     Resp 11/05/15 2215 22     Temp 11/05/15 2215 99.3 F (37.4 C)     Temp Source 11/05/15 2215 Oral     SpO2 11/05/15 2215 98 %     Weight 11/05/15 2215 155 lb (70.308 kg)     Height 11/05/15 2215 5\' 2"  (1.575 m)     Head Cir --      Peak Flow --      Pain Score 11/05/15 2216 9     Pain Loc --      Pain Edu? --      Excl. in GC? --     Constitutional: Alert and oriented. Well appearing and in Moderate distress. Eyes: Conjunctivae are normal. PERRL. EOMI. Head: Atraumatic. Nose: No congestion/rhinnorhea. Mouth/Throat: Mucous membranes are moist.  Oropharynx non-erythematous. Cardiovascular: Tachycardia, regular rhythm. Grossly normal heart sounds.  Good peripheral circulation. Respiratory: Normal respiratory effort.  No retractions. Lungs CTAB. Gastrointestinal: Soft with some diffuse mild tenderness to palpation No distention. Positive bowel sounds Musculoskeletal: No lower extremity tenderness nor edema.  Neurologic:  Normal speech and language. No gross focal neurologic deficits are appreciated. No gait instability. Skin:  Skin is warm, dry and  intact. No rash noted. Psychiatric: Mood and affect are normal. Speech and behavior are normal.  ____________________________________________   LABS (all labs ordered are listed, but only abnormal results are displayed)  Labs Reviewed  GLUCOSE, CAPILLARY - Abnormal; Notable for the following:    Glucose-Capillary 169 (*)  All other components within normal limits  BASIC METABOLIC PANEL - Abnormal; Notable for the following:    Glucose, Bld 174 (*)    Creatinine, Ser 1.05 (*)    GFR calc non Af Amer 59 (*)    All other components within normal limits  CBC - Abnormal; Notable for the following:    RBC 5.72 (*)    MCV 77.6 (*)    All other components within normal limits  URINALYSIS COMPLETEWITH MICROSCOPIC ([facility] ONLY) - Abnormal; Notable for the following:    Color, Urine YELLOW (*)    APPearance HAZY (*)    Hgb urine dipstick 1+ (*)    Protein, ur 30 (*)    Leukocytes, UA TRACE (*)    Squamous Epithelial / LPF 0-5 (*)    All other components within normal limits  GLUCOSE, CAPILLARY - Abnormal; Notable for the following:    Glucose-Capillary 136 (*)    All other components within normal limits  TROPONIN I  CBG MONITORING, ED   ____________________________________________  EKG  ED ECG REPORT I, Rebecka ApleyWebster,  Allison P, the attending physician, personally viewed and interpreted this ECG.   Date: 11/05/2015  EKG Time: 2224  Rate: 112  Rhythm: sinus tachycardia  Axis: normal  Intervals:none  ST&T Change: none  ____________________________________________  RADIOLOGY  CT head and cervical spine: No acute intracranial pathology, no acute traumatic cervical spine pathology, partially visualize 14 mm dysrhythmic nodule further evaluation with thyroid ultrasound recommended.  Right shoulder x-ray: Negative ____________________________________________   PROCEDURES  Procedure(s) performed: None  Critical Care performed:  No  ____________________________________________   INITIAL IMPRESSION / ASSESSMENT AND PLAN / ED COURSE  Pertinent labs & imaging results that were available during my care of the patient were reviewed by me and considered in my medical decision making (see chart for details).  This is a 55 year old patient who comes into the hospital today with elevated blood sugars of fall and body pains. The patient's blood sugars when she arrived to the hospital were in the 170s but she is tachycardic. I did give her a liter of normal saline. The patient also is having some belly pain so I gave her a GI cocktail and a dose of Toradol. I informed the patient of the results of her blood work. I gave her a second liter of normal saline as she continued to be tachycardic and her heart rate came down to the low 100s to high 90s. The patient was complaining of some pain in her legs as well as tingling but she does have a history of neuropathy for which she takes gabapentin. I informed the patient that she does need to follow back up with her primary care physician. She does not have any acute traumatic injury and her fingerstick is unremarkable. Her repeat fingerstick was in the 130s. The patient will be discharged home to follow-up with her primary care physician. She has no further complaints or concerns at this time. ____________________________________________   FINAL CLINICAL IMPRESSION(S) / ED DIAGNOSES  Final diagnoses:  Hyperglycemia  Body aches  Dehydration      NEW MEDICATIONS STARTED DURING THIS VISIT:  Discharge Medication List as of 11/06/2015  4:04 AM       Note:  This document was prepared using Dragon voice recognition software and may include unintentional dictation errors.    Rebecka ApleyAllison P Webster, MD 11/06/15 [id number]

## 2015-11-06 NOTE — Discharge Instructions (Signed)
Dehydration, Adult Dehydration is a condition in which you do not have enough fluid or water in your body. It happens when you take in less fluid than you lose. Vital organs such as the kidneys, brain, and heart cannot function without a proper amount of fluids. Any loss of fluids from the body can cause dehydration.  Dehydration can range from mild to severe. This condition should be treated right away to help prevent it from becoming severe. CAUSES  This condition may be caused by:  Vomiting.  Diarrhea.  Excessive sweating, such as when exercising in hot or humid weather.  Not drinking enough fluid during strenuous exercise or during an illness.  Excessive urine output.  Fever.  Certain medicines. RISK FACTORS This condition is more likely to develop in:  People who are taking certain medicines that cause the body to lose excess fluid (diuretics).   People who have a chronic illness, such as diabetes, that may increase urination.  Older adults.   People who live at high altitudes.   People who participate in endurance sports.  SYMPTOMS  Mild Dehydration  Thirst.  Dry lips.  Slightly dry mouth.  Dry, warm skin. Moderate Dehydration  Very dry mouth.   Muscle cramps.   Dark urine and decreased urine production.   Decreased tear production.   Headache.   Light-headedness, especially when you stand up from a sitting position.  Severe Dehydration  Changes in skin.   Cold and clammy skin.   Skin does not spring back quickly when lightly pinched and released.   Changes in body fluids.   Extreme thirst.   No tears.   Not able to sweat when body temperature is high, such as in hot weather.   Minimal urine production.   Changes in vital signs.   Rapid, weak pulse (more than 100 beats per minute when you are sitting still).   Rapid breathing.   Low blood pressure.   Other changes.   Sunken eyes.   Cold hands and feet.    Confusion.  Lethargy and difficulty being awakened.  Fainting (syncope).   Short-term weight loss.   Unconsciousness. DIAGNOSIS  This condition may be diagnosed based on your symptoms. You may also have tests to determine how severe your dehydration is. These tests may include:   Urine tests.   Blood tests.  TREATMENT  Treatment for this condition depends on the severity. Mild or moderate dehydration can often be treated at home. Treatment should be started right away. Do not wait until dehydration becomes severe. Severe dehydration needs to be treated at the hospital. Treatment for Mild Dehydration  Drinking plenty of water to replace the fluid you have lost.   Replacing minerals in your blood (electrolytes) that you may have lost.  Treatment for Moderate Dehydration  Consuming oral rehydration solution (ORS). Treatment for Severe Dehydration  Receiving fluid through an IV tube.   Receiving electrolyte solution through a feeding tube that is passed through your nose and into your stomach (nasogastric tube or NG tube).  Correcting any abnormalities in electrolytes. HOME CARE INSTRUCTIONS   Drink enough fluid to keep your urine clear or pale yellow.   Drink water or fluid slowly by taking small sips. You can also try sucking on ice cubes.  Have food or beverages that contain electrolytes. Examples include bananas and sports drinks.  Take over-the-counter and prescription medicines only as told by your health care provider.   Prepare ORS according to the manufacturer's instructions. Take sips  of ORS every 5 minutes until your urine returns to normal.  If you have vomiting or diarrhea, continue to try to drink water, ORS, or both.   If you have diarrhea, avoid:   Beverages that contain caffeine.   Fruit juice.   Milk.   Carbonated soft drinks.  Do not take salt tablets. This can lead to the condition of having too much sodium in your body  (hypernatremia).  SEEK MEDICAL CARE IF:  You cannot eat or drink without vomiting.  You have had moderate diarrhea during a period of more than 24 hours.  You have a fever. SEEK IMMEDIATE MEDICAL CARE IF:   You have extreme thirst.  You have severe diarrhea.  You have not urinated in 6-8 hours, or you have urinated only a small amount of very dark urine.  You have shriveled skin.  You are dizzy, confused, or both.   This information is not intended to replace advice given to you by your health care provider. Make sure you discuss any questions you have with your health care provider.   Document Released: 05/18/2005 Document Revised: 02/06/2015 Document Reviewed: 10/03/2014 Elsevier Interactive Patient Education 2016 [employer]  Hyperglycemia Hyperglycemia occurs when the glucose (sugar) in your blood is too high. Hyperglycemia can happen for many reasons, but it most often happens to people who do not know they have diabetes or are not managing their diabetes properly.  CAUSES  Whether you have diabetes or not, there are other causes of hyperglycemia. Hyperglycemia can occur when you have diabetes, but it can also occur in other situations that you might not be as aware of, such as: Diabetes  If you have diabetes and are having problems controlling your blood glucose, hyperglycemia could occur because of some of the following reasons:  Not following your meal plan.  Not taking your diabetes medications or not taking it properly.  Exercising less or doing less activity than you normally do.  Being sick. Pre-diabetes  This cannot be ignored. Before people develop Type 2 diabetes, they almost always have "pre-diabetes." This is when your blood glucose levels are higher than normal, but not yet high enough to be diagnosed as diabetes. Research has shown that some long-term damage to the body, especially the heart and circulatory system, may already be occurring during  pre-diabetes. If you take action to manage your blood glucose when you have pre-diabetes, you may delay or prevent Type 2 diabetes from developing. Stress  If you have diabetes, you may be "diet" controlled or on oral medications or insulin to control your diabetes. However, you may find that your blood glucose is higher than usual in the hospital whether you have diabetes or not. This is often referred to as "stress hyperglycemia." Stress can elevate your blood glucose. This happens because of hormones put out by the body during times of stress. If stress has been the cause of your high blood glucose, it can be followed regularly by your caregiver. That way he/she can make sure your hyperglycemia does not continue to get worse or progress to diabetes. Steroids  Steroids are medications that act on the infection fighting system (immune system) to block inflammation or infection. One side effect can be a rise in blood glucose. Most people can produce enough extra insulin to allow for this rise, but for those who cannot, steroids make blood glucose levels go even higher. It is not unusual for steroid treatments to "uncover" diabetes that is developing. It is not  always possible to determine if the hyperglycemia will go away after the steroids are stopped. A special blood test called an A1c is sometimes done to determine if your blood glucose was elevated before the steroids were started. SYMPTOMS  Thirsty.  Frequent urination.  Dry mouth.  Blurred vision.  Tired or fatigue.  Weakness.  Sleepy.  Tingling in feet or leg. DIAGNOSIS  Diagnosis is made by monitoring blood glucose in one or all of the following ways:  A1c test. This is a chemical found in your blood.  Fingerstick blood glucose monitoring.  Laboratory results. TREATMENT  First, knowing the cause of the hyperglycemia is important before the hyperglycemia can be treated. Treatment may include, but is not be limited  to:  Education.  Change or adjustment in medications.  Change or adjustment in meal plan.  Treatment for an illness, infection, etc.  More frequent blood glucose monitoring.  Change in exercise plan.  Decreasing or stopping steroids.  Lifestyle changes. HOME CARE INSTRUCTIONS   Test your blood glucose as directed.  Exercise regularly. Your caregiver will give you instructions about exercise. Pre-diabetes or diabetes which comes on with stress is helped by exercising.  Eat wholesome, balanced meals. Eat often and at regular, fixed times. Your caregiver or nutritionist will give you a meal plan to guide your sugar intake.  Being at an ideal weight is important. If needed, losing as little as 10 to 15 pounds may help improve blood glucose levels. SEEK MEDICAL CARE IF:   You have questions about medicine, activity, or diet.  You continue to have symptoms (problems such as increased thirst, urination, or weight gain). SEEK IMMEDIATE MEDICAL CARE IF:   You are vomiting or have diarrhea.  Your breath smells fruity.  You are breathing faster or slower.  You are very sleepy or incoherent.  You have numbness, tingling, or pain in your feet or hands.  You have chest pain.  Your symptoms get worse even though you have been following your caregiver's orders.  If you have any other questions or concerns.   This information is not intended to replace advice given to you by your health care provider. Make sure you discuss any questions you have with your health care provider.   Document Released: 11/11/2000 Document Revised: 08/10/2011 Document Reviewed: 01/22/2015 Elsevier Interactive Patient Education 2016 [employer] Inc.  Musculoskeletal Pain Musculoskeletal pain is muscle and boney aches and pains. These pains can occur in any part of the body. Your caregiver may treat you without knowing the cause of the pain. They may treat you if blood or urine tests, X-rays, and other  tests were normal.  CAUSES There is often not a definite cause or reason for these pains. These pains may be caused by a type of germ (virus). The discomfort may also come from overuse. Overuse includes working out too hard when your body is not fit. Boney aches also come from weather changes. Bone is sensitive to atmospheric pressure changes. HOME CARE INSTRUCTIONS   Ask when your test results will be ready. Make sure you get your test results.  Only take over-the-counter or prescription medicines for pain, discomfort, or fever as directed by your caregiver. If you were given medications for your condition, do not drive, operate machinery or power tools, or sign legal documents for 24 hours. Do not drink alcohol. Do not take sleeping pills or other medications that may interfere with treatment.  Continue all activities unless the activities cause more pain. When the  pain lessens, slowly resume normal activities. Gradually increase the intensity and duration of the activities or exercise.  During periods of severe pain, bed rest may be helpful. Lay or sit in any position that is comfortable.  Putting ice on the injured area.  Put ice in a bag.  Place a towel between your skin and the bag.  Leave the ice on for 15 to 20 minutes, 3 to 4 times a day.  Follow up with your caregiver for continued problems and no reason can be found for the pain. If the pain becomes worse or does not go away, it may be necessary to repeat tests or do additional testing. Your caregiver may need to look further for a possible cause. SEEK IMMEDIATE MEDICAL CARE IF:  You have pain that is getting worse and is not relieved by medications.  You develop chest pain that is associated with shortness or breath, sweating, feeling sick to your stomach (nauseous), or throw up (vomit).  Your pain becomes localized to the abdomen.  You develop any new symptoms that seem different or that concern you. MAKE SURE YOU:    Understand these instructions.  Will watch your condition.  Will get help right away if you are not doing well or get worse.   This information is not intended to replace advice given to you by your health care provider. Make sure you discuss any questions you have with your health care provider.   Document Released: 05/18/2005 Document Revised: 08/10/2011 Document Reviewed: 01/20/2013 Elsevier Interactive Patient Education [employer]

## 2015-11-14 ENCOUNTER — Emergency Department
Admission: EM | Admit: 2015-11-14 | Discharge: 2015-11-14 | Disposition: A | Discharge status: Home / Self Care | Payer: BLUE CROSS/BLUE SHIELD | Attending: Emergency Medicine | Admitting: Emergency Medicine

## 2015-11-14 ENCOUNTER — Encounter: Payer: Self-pay | Admitting: Emergency Medicine

## 2015-11-14 DIAGNOSIS — K2970 Gastritis, unspecified, without bleeding: Secondary | ICD-10-CM

## 2015-11-14 DIAGNOSIS — E119 Type 2 diabetes mellitus without complications: Secondary | ICD-10-CM | POA: Insufficient documentation

## 2015-11-14 DIAGNOSIS — Z87891 Personal history of nicotine dependence: Secondary | ICD-10-CM | POA: Insufficient documentation

## 2015-11-14 DIAGNOSIS — R112 Nausea with vomiting, unspecified: Secondary | ICD-10-CM | POA: Diagnosis present

## 2015-11-14 DIAGNOSIS — I10 Essential (primary) hypertension: Secondary | ICD-10-CM | POA: Insufficient documentation

## 2015-11-14 LAB — CBC
HCT: 41.7 % (ref 35.0–47.0)
Hemoglobin: 13.6 g/dL (ref 12.0–16.0)
MCH: 26 pg (ref 26.0–34.0)
MCHC: 32.6 g/dL (ref 32.0–36.0)
MCV: 79.8 fL — AB (ref 80.0–100.0)
PLATELETS: 414 10*3/uL (ref 150–440)
RBC: 5.23 MIL/uL — AB (ref 3.80–5.20)
RDW: 14.5 % (ref 11.5–14.5)
WBC: 10.1 10*3/uL (ref 3.6–11.0)

## 2015-11-14 LAB — COMPREHENSIVE METABOLIC PANEL
ALT: 63 U/L — AB (ref 14–54)
AST: 68 U/L — ABNORMAL HIGH (ref 15–41)
Albumin: 4.7 g/dL (ref 3.5–5.0)
Alkaline Phosphatase: 101 U/L (ref 38–126)
Anion gap: 11 (ref 5–15)
BUN: 8 mg/dL (ref 6–20)
CHLORIDE: 105 mmol/L (ref 101–111)
CO2: 25 mmol/L (ref 22–32)
CREATININE: 0.72 mg/dL (ref 0.44–1.00)
Calcium: 9.5 mg/dL (ref 8.9–10.3)
Glucose, Bld: 165 mg/dL — ABNORMAL HIGH (ref 65–99)
Potassium: 3.8 mmol/L (ref 3.5–5.1)
Sodium: 141 mmol/L (ref 135–145)
TOTAL PROTEIN: 8.5 g/dL — AB (ref 6.5–8.1)
Total Bilirubin: 0.9 mg/dL (ref 0.3–1.2)

## 2015-11-14 LAB — URINALYSIS COMPLETE WITH MICROSCOPIC (ARMC ONLY)
BILIRUBIN URINE: NEGATIVE
GLUCOSE, UA: NEGATIVE mg/dL
HGB URINE DIPSTICK: NEGATIVE
KETONES UR: NEGATIVE mg/dL
NITRITE: NEGATIVE
Protein, ur: 30 mg/dL — AB
SPECIFIC GRAVITY, URINE: 1.016 (ref 1.005–1.030)
pH: 5 (ref 5.0–8.0)

## 2015-11-14 LAB — LIPASE, BLOOD: LIPASE: 26 U/L (ref 11–51)

## 2015-11-14 MED ORDER — ONDANSETRON 4 MG PO TBDP
4.0000 mg | ORAL_TABLET | Freq: Once | ORAL | Status: AC | PRN
  Administered 2015-11-14: 4 mg via ORAL
  Filled 2015-11-14: qty 1

## 2015-11-14 MED ORDER — ONDANSETRON HCL 4 MG PO TABS: 4.0000 mg | ORAL_TABLET | Freq: Every day | ORAL | Status: AC | PRN

## 2015-11-14 MED ORDER — GI COCKTAIL ~~LOC~~
30.0000 mL | Freq: Once | ORAL | Status: AC
  Administered 2015-11-14: 30 mL via ORAL
  Filled 2015-11-14: qty 30

## 2015-11-14 MED ORDER — MORPHINE SULFATE (PF) 2 MG/ML IV SOLN
2.0000 mg | Freq: Once | INTRAVENOUS | Status: AC
  Administered 2015-11-14: 2 mg via INTRAVENOUS
  Filled 2015-11-14: qty 1

## 2015-11-14 MED ORDER — ONDANSETRON HCL 4 MG/2ML IJ SOLN
4.0000 mg | Freq: Once | INTRAMUSCULAR | Status: AC
  Administered 2015-11-14: 4 mg via INTRAVENOUS
  Filled 2015-11-14: qty 2

## 2015-11-14 MED ORDER — CYCLOBENZAPRINE HCL 10 MG PO TABS: 10.0000 mg | ORAL_TABLET | Freq: Three times a day (TID) | ORAL | Status: DC | PRN

## 2015-11-14 MED ORDER — SUCRALFATE 1 G PO TABS: 1.0000 g | ORAL_TABLET | Freq: Four times a day (QID) | ORAL | Status: AC

## 2015-11-14 MED ORDER — SODIUM CHLORIDE 0.9 % IV SOLN
1000.0000 mL | Freq: Once | INTRAVENOUS | Status: AC
  Administered 2015-11-14: 1000 mL via INTRAVENOUS

## 2015-11-14 NOTE — ED Notes (Signed)
Patient presents to ED with steady gait c/o vomiting for 2 days.  PAtient reprots was seen here for the same a week ago but reports vomiting began again this week. Patient denies chest pain or shortness of breath. Patient alert and oriented x 4, respirations even and unlabored.

## 2015-11-14 NOTE — ED Notes (Signed)
AAOx3.  Skin warm and dry. NAD.  Ambulates with easy and steady gait.   

## 2015-11-14 NOTE — Discharge Instructions (Signed)

## 2015-11-14 NOTE — ED Provider Notes (Signed)
[facility] Emergency Department Provider Note  ____________________________________________    I have reviewed the triage vital signs and the nursing notes.   HISTORY  Chief Complaint Emesis    HPI Amai Varnum is a 55 y.o. female who presents with mild to moderate epigastric discomfort and nausea and vomiting. Patient reports she has been vomiting most of the night. She describes a burning sensation in her epigastrium. She reports she had an endoscopy 1 month ago but is not certain of the results. She denies fevers or chills. No diarrhea. She has a history of diabetes, she reports normal stools. She denies recent NSAID ingestion     Past Medical History  Diagnosis Date  . Diabetes mellitus without complication (HCC)   . Thyroid disease   . Liver disease   . Herniated cervical disc   . Hypertension   . Allergy   . Sleep apnea     Patient Active Problem List   Diagnosis Date Noted  . Syncope 10/19/2015  . GI bleed 09/13/2015  . DDD (degenerative disc disease), lumbar 03/19/2015  . Lumbar radiculopathy 03/19/2015  . Facet syndrome, lumbar 03/19/2015  . Bilateral occipital neuralgia 03/19/2015    Past Surgical History  Procedure Laterality Date  . Ablation    . Cholecystectomy    . Cesarean section    . Liver biopsy    . Esophagogastroduodenoscopy (egd) with propofol N/A 09/14/2015    Procedure: ESOPHAGOGASTRODUODENOSCOPY (EGD) WITH PROPOFOL;  Surgeon: Wallace Cullens, MD;  Location: [facility] ENDOSCOPY;  Service: Endoscopy;  Laterality: N/A;    Current Outpatient Rx  Name  Route  Sig  Dispense  Refill  . cyclobenzaprine (FLEXERIL) 10 MG tablet   Oral   Take 1 tablet (10 mg total) by mouth every 8 (eight) hours as needed for muscle spasms.   30 tablet   1   . diazepam (VALIUM) 5 MG tablet   Oral   Take 5 mg by mouth every 12 (twelve) hours as needed for anxiety.         Marland Kitchen HYDROcodone-acetaminophen (NORCO/VICODIN) 5-325 MG tablet   Oral  Take 1 tablet by mouth every 4 (four) hours as needed for moderate pain.   15 tablet   0   . LINZESS 145 MCG CAPS capsule   Oral   Take 145 mcg by mouth daily.      2     Dispense as written.   . metFORMIN (GLUMETZA) 500 MG (MOD) 24 hr tablet   Oral   Take 500 mg by mouth daily with breakfast.         . pantoprazole (PROTONIX) 40 MG tablet   Oral   Take 1 tablet (40 mg total) by mouth 2 (two) times daily.   60 tablet   0   . PARoxetine (PAXIL) 40 MG tablet   Oral   Take 40 mg by mouth daily.         Marland Kitchen rOPINIRole (REQUIP) 1 MG tablet   Oral   Take 1 mg by mouth at bedtime.         . simethicone (GAS-X) 80 MG chewable tablet   Oral   Chew 1 tablet (80 mg total) by mouth every 6 (six) hours as needed for flatulence.   30 tablet   0   . tiZANidine (ZANAFLEX) 4 MG tablet   Oral   Take 4 mg by mouth every 6 (six) hours as needed for muscle spasms.  Allergies Sulfa antibiotics; Fentanyl; Penicillins; and Topiramate  Family History  Problem Relation Age of Onset  . Hypertension Mother   . Stroke Mother   . Diabetes Father   . Heart disease Father     Social History Social History  Substance Use Topics  . Smoking status: Former Smoker    Types: Cigarettes  . Smokeless tobacco: None  . Alcohol Use: 0.0 oz/week    0 Standard drinks or equivalent per week     Comment: occas.    Review of Systems  Constitutional: Negative for fever. Eyes: Negative for redness ENT: Negative for sore throat Cardiovascular: Negative for chest pain Respiratory: Negative for shortness of breath. Gastrointestinal: As above Genitourinary: Negative for dysuria. Musculoskeletal: Negative for back pain. Skin: Negative for rash. Neurological: Negative for focal weakness Psychiatric: no anxiety    ____________________________________________   PHYSICAL EXAM:  VITAL SIGNS: ED Triage Vitals  Enc Vitals Group     BP 11/14/15 0601 150/65 mmHg     Pulse Rate  11/14/15 0601 100     Resp 11/14/15 0601 18     Temp 11/14/15 0601 98.4 F (36.9 C)     Temp Source 11/14/15 0601 Oral     SpO2 11/14/15 0601 98 %     Weight 11/14/15 0601 150 lb (68.04 kg)     Height 11/14/15 0601 5\' 2"  (1.575 m)     Head Cir --      Peak Flow --      Pain Score 11/14/15 0603 9     Pain Loc --      Pain Edu? --      Excl. in GC? --     Constitutional: Alert and oriented. Well appearing and in no distress.  Eyes: Conjunctivae are normal. No erythema or injection ENT   Head: Normocephalic and atraumatic.   Mouth/Throat: Mucous membranes are moist. Cardiovascular: Normal rate, regular rhythm. Normal and symmetric distal pulses are present in the upper extremities.  Respiratory: Normal respiratory effort without tachypnea nor retractions. Breath sounds are clear and equal bilaterally.  Gastrointestinal: Soft and non-tender in all quadrants. No distention. There is no CVA tenderness. Genitourinary: deferred Musculoskeletal: Nontender with normal range of motion in all extremities. No lower extremity tenderness nor edema. Neurologic:  Normal speech and language. No gross focal neurologic deficits are appreciated. Skin:  Skin is warm, dry and intact. No rash noted. Psychiatric: Mood and affect are normal. Patient exhibits appropriate insight and judgment.  ____________________________________________    LABS (pertinent positives/negatives)  Labs Reviewed  COMPREHENSIVE METABOLIC PANEL - Abnormal; Notable for the following:    Glucose, Bld 165 (*)    Total Protein 8.5 (*)    AST 68 (*)    ALT 63 (*)    All other components within normal limits  CBC - Abnormal; Notable for the following:    RBC 5.23 (*)    MCV 79.8 (*)    All other components within normal limits  URINALYSIS COMPLETEWITH MICROSCOPIC ([facility] ONLY) - Abnormal; Notable for the following:    Color, Urine YELLOW (*)    APPearance HAZY (*)    Protein, ur 30 (*)    Leukocytes, UA TRACE (*)     Bacteria, UA RARE (*)    Squamous Epithelial / LPF 6-30 (*)    All other components within normal limits  LIPASE, BLOOD    ____________________________________________   EKG  None  ____________________________________________    RADIOLOGY  None  _________________None___________________________   PROCEDURES  Procedure(s) performed: none  Critical Care performed: none  ____________________________________________   INITIAL IMPRESSION / ASSESSMENT AND PLAN / ED COURSE  Pertinent labs & imaging results that were available during my care of the patient were reviewed by me and considered in my medical decision making (see chart for details).  I reviewed patient's endoscopy results. H. pylori negative, positive gastritis and possible PUD last month. We will treat with GI cocktail, IV fluids and Zofran and reevaluate.  Patient feels better after GI cocktail, IV Zofran and fluids. We will discharge her with Carafate and Zofran and follow up with GI. I strongly recommended that she take her Prilosec which she has not been taking.  ____________________________________________   FINAL CLINICAL IMPRESSION(S) / ED DIAGNOSES  Final diagnoses:  Gastritis          Jene Every, MD 11/14/15 [phone]

## 2015-12-10 ENCOUNTER — Emergency Department
Admission: EM | Admit: 2015-12-10 | Discharge: 2015-12-10 | Disposition: A | Discharge status: Home / Self Care | Payer: BLUE CROSS/BLUE SHIELD | Attending: Emergency Medicine | Admitting: Emergency Medicine

## 2015-12-10 ENCOUNTER — Encounter: Payer: Self-pay | Admitting: Emergency Medicine

## 2015-12-10 DIAGNOSIS — Z87891 Personal history of nicotine dependence: Secondary | ICD-10-CM | POA: Insufficient documentation

## 2015-12-10 DIAGNOSIS — E119 Type 2 diabetes mellitus without complications: Secondary | ICD-10-CM | POA: Insufficient documentation

## 2015-12-10 DIAGNOSIS — Z794 Long term (current) use of insulin: Secondary | ICD-10-CM | POA: Insufficient documentation

## 2015-12-10 DIAGNOSIS — M5416 Radiculopathy, lumbar region: Secondary | ICD-10-CM | POA: Diagnosis not present

## 2015-12-10 DIAGNOSIS — M549 Dorsalgia, unspecified: Secondary | ICD-10-CM | POA: Diagnosis present

## 2015-12-10 LAB — URINALYSIS COMPLETE WITH MICROSCOPIC (ARMC ONLY)
BILIRUBIN URINE: NEGATIVE
Glucose, UA: NEGATIVE mg/dL
Hgb urine dipstick: NEGATIVE
KETONES UR: NEGATIVE mg/dL
NITRITE: POSITIVE — AB
Protein, ur: NEGATIVE mg/dL
SPECIFIC GRAVITY, URINE: 1.008 (ref 1.005–1.030)
pH: 5 (ref 5.0–8.0)

## 2015-12-10 MED ORDER — NITROFURANTOIN MONOHYD MACRO 100 MG PO CAPS: 100.0000 mg | ORAL_CAPSULE | Freq: Two times a day (BID) | ORAL | Status: AC

## 2015-12-10 MED ORDER — OXYCODONE-ACETAMINOPHEN 5-325 MG PO TABS: 1.0000 | ORAL_TABLET | ORAL | Status: AC | PRN

## 2015-12-10 MED ORDER — DEXAMETHASONE SODIUM PHOSPHATE 10 MG/ML IJ SOLN
10.0000 mg | Freq: Once | INTRAMUSCULAR | Status: AC
  Administered 2015-12-10: 10 mg via INTRAMUSCULAR
  Filled 2015-12-10: qty 1

## 2015-12-10 MED ORDER — HYDROMORPHONE HCL 1 MG/ML IJ SOLN
1.0000 mg | Freq: Once | INTRAMUSCULAR | Status: AC
  Administered 2015-12-10: 1 mg via INTRAMUSCULAR
  Filled 2015-12-10: qty 1

## 2015-12-10 NOTE — ED Notes (Signed)
Pt to ed with c/o lower back pain that started on Sunday,  Pt denies injury, states hx of chronic back pain.

## 2015-12-10 NOTE — Discharge Instructions (Signed)
Radicular Pain °Radicular pain in either the arm or leg is usually from a bulging or herniated disk in the spine. A piece of the herniated disk may press against the nerves as the nerves exit the spine. This causes pain which is felt at the tips of the nerves down the arm or leg. Other causes of radicular pain may include: °· Fractures. °· Heart disease. °· Cancer. °· An abnormal and usually degenerative state of the nervous system or nerves (neuropathy). °Diagnosis may require CT or MRI scanning to determine the primary cause.  °Nerves that start at the neck (nerve roots) may cause radicular pain in the outer shoulder and arm. It can spread down to the thumb and fingers. The symptoms vary depending on which nerve root has been affected. In most cases radicular pain improves with conservative treatment. Neck problems may require physical therapy, a neck collar, or cervical traction. Treatment may take many weeks, and surgery may be considered if the symptoms do not improve.  °Conservative treatment is also recommended for sciatica. Sciatica causes pain to radiate from the lower back or buttock area down the leg into the foot. Often there is a history of back problems. Most patients with sciatica are better after 2 to 4 weeks of rest and other supportive care. Short term bed rest can reduce the disk pressure considerably. Sitting, however, is not a good position since this increases the pressure on the disk. You should avoid bending, lifting, and all other activities which make the problem worse. Traction can be used in severe cases. Surgery is usually reserved for patients who do not improve within the first months of treatment. °Only take over-the-counter or prescription medicines for pain, discomfort, or fever as directed by your caregiver. Narcotics and muscle relaxants may help by relieving more severe pain and spasm and by providing mild sedation. Cold or massage can give significant relief. Spinal manipulation  is not recommended. It can increase the degree of disc protrusion. Epidural steroid injections are often effective treatment for radicular pain. These injections deliver medicine to the spinal nerve in the space between the protective covering of the spinal cord and back bones (vertebrae). Your caregiver can give you more information about steroid injections. These injections are most effective when given within two weeks of the onset of pain.  °You should see your caregiver for follow up care as recommended. A program for neck and back injury rehabilitation with stretching and strengthening exercises is an important part of management.  °SEEK IMMEDIATE MEDICAL CARE IF: °· You develop increased pain, weakness, or numbness in your arm or leg. °· You develop difficulty with bladder or bowel control. °· You develop abdominal pain. °  °This information is not intended to replace advice given to you by your health care provider. Make sure you discuss any questions you have with your health care provider. °  °Document Released: 06/25/2004 Document Revised: 06/08/2014 Document Reviewed: 12/12/2014 °Elsevier Interactive Patient Education ©2016 Else[employer] Inc[employer] ° °

## 2015-12-10 NOTE — ED Notes (Signed)
See triage note   States she developed lower back pain a couple of days ago.Marland Kitchen.ambulates slowly but no limp also denies any urinary sxs'

## 2015-12-10 NOTE — ED Provider Notes (Signed)
[facility] Emergency Department Provider Note   ____________________________________________  Time seen: Approximately 10:27 AM  I have reviewed the triage vital signs and the nursing notes.   HISTORY  Chief Complaint Back Pain   HPI Christy Mcbride is a 55 y.o. female who presents today for an acute exacerbation of chronic back pain. She has had previous imaging that showed herniated disks. At that time patient was advised to have corrective surgery. Patient denied surgery.  Two days ago, patient states that she was walking [place] the beach Sunday (12/08/15) when she started to feel a pain in her back. The pain is constant and has corresponding numbness and tingling shooting down her left leg. The pain is worse with walking and sitting in a low seat. She has tried alternating heat and ice and over the counter anti-inflammatories without improvement. Patient denies any changes in bowel or bladder function. Patient rates the pain as a 9/10.   Past Medical History  Diagnosis Date  . Diabetes mellitus without complication (HCC)   . Thyroid disease   . Liver disease   . Herniated cervical disc   . Hypertension   . Allergy   . Sleep apnea     Patient Active Problem List   Diagnosis Date Noted  . Syncope 10/19/2015  . GI bleed 09/13/2015  . DDD (degenerative disc disease), lumbar 03/19/2015  . Lumbar radiculopathy 03/19/2015  . Facet syndrome, lumbar 03/19/2015  . Bilateral occipital neuralgia 03/19/2015    Past Surgical History  Procedure Laterality Date  . Ablation    . Cholecystectomy    . Cesarean section    . Liver biopsy    . Esophagogastroduodenoscopy (egd) with propofol N/A 09/14/2015    Procedure: ESOPHAGOGASTRODUODENOSCOPY (EGD) WITH PROPOFOL;  Surgeon: Wallace Cullens, MD;  Location: [facility] ENDOSCOPY;  Service: Endoscopy;  Laterality: N/A;    Current Outpatient Rx  Name  Route  Sig  Dispense  Refill  . BuPROPion HCl (WELLBUTRIN PO)   Oral   Take 1  tablet by mouth daily.          . cyclobenzaprine (FLEXERIL) 10 MG tablet   Oral   Take 1 tablet (10 mg total) by mouth every 8 (eight) hours as needed for muscle spasms.   20 tablet   1   . diazepam (VALIUM) 5 MG tablet   Oral   Take 5 mg by mouth every 12 (twelve) hours as needed for anxiety.         Marland Kitchen HYDROcodone-acetaminophen (NORCO/VICODIN) 5-325 MG tablet   Oral   Take 1 tablet by mouth every 4 (four) hours as needed for moderate pain. Patient not taking: Reported on 11/14/2015   15 tablet   0   . insulin detemir (LEVEMIR) 100 UNIT/ML injection   Subcutaneous   Inject 10 Units into the skin at bedtime.         Marland Kitchen LINZESS 145 MCG CAPS capsule   Oral   Take 145 mcg by mouth daily.      2     Dispense as written.   . ondansetron (ZOFRAN) 4 MG tablet   Oral   Take 1 tablet (4 mg total) by mouth daily as needed for nausea or vomiting.   20 tablet   1   . oxyCODONE-acetaminophen (ROXICET) 5-325 MG tablet   Oral   Take 1 tablet by mouth every 4 (four) hours as needed for severe pain.   30 tablet   0   .  pantoprazole (PROTONIX) 40 MG tablet   Oral   Take 1 tablet (40 mg total) by mouth 2 (two) times daily.   60 tablet   0   . PARoxetine (PAXIL) 40 MG tablet   Oral   Take 40 mg by mouth daily.         Marland Kitchen rOPINIRole (REQUIP) 1 MG tablet   Oral   Take 1 mg by mouth at bedtime.         . simethicone (GAS-X) 80 MG chewable tablet   Oral   Chew 1 tablet (80 mg total) by mouth every 6 (six) hours as needed for flatulence.   30 tablet   0   . sucralfate (CARAFATE) 1 g tablet   Oral   Take 1 tablet (1 g total) by mouth 4 (four) times daily.   60 tablet   0   . tiZANidine (ZANAFLEX) 4 MG tablet   Oral   Take 4 mg by mouth every 6 (six) hours as needed for muscle spasms.           Allergies Sulfa antibiotics; Fentanyl; Penicillins; and Topiramate  Family History  Problem Relation Age of Onset  . Hypertension Mother   . Stroke Mother   .  Diabetes Father   . Heart disease Father     Social History Social History  Substance Use Topics  . Smoking status: Former Smoker    Types: Cigarettes  . Smokeless tobacco: None  . Alcohol Use: 0.0 oz/week    0 Standard drinks or equivalent per week     Comment: occas.    Review of Systems Constitutional: No fever/chills Eyes: No visual changes. ENT: No sore throat. Cardiovascular: Denies chest pain. Respiratory: Denies shortness of breath. Gastrointestinal: No abdominal pain.  No nausea, no vomiting.  No diarrhea.  No constipation. Genitourinary: Negative for dysuria. Musculoskeletal: Positive for chronic back pain  Skin: Negative for rash. Neurological: Negative for headaches, focal weakness or numbness. Endocrine:Hypertension, hypothyroidism, diabetes. Allergic/Immunilogical: Sulfa, fentanyl, penicillin, and Topamax.  ____________________________________________   PHYSICAL EXAM:  VITAL SIGNS: ED Triage Vitals  Enc Vitals Group     BP 12/10/15 1018 105/63 mmHg     Pulse Rate 12/10/15 1018 84     Resp 12/10/15 1018 16     Temp 12/10/15 1018 98.2 F (36.8 C)     Temp Source 12/10/15 1018 Oral     SpO2 12/10/15 1018 89 %     Weight 12/10/15 1018 153 lb (69.4 kg)     Height 12/10/15 1018  (1.575 m)     Head Cir --      Peak Flow --      Pain Score 12/10/15 1002 9     Pain Loc --      Pain Edu? --      Excl. in GC? --     Constitutional: Alert and oriented. Well appearing and in no acute distress. Eyes: Conjunctivae are normal. PERRL. EOMI. Head: Atraumatic. Nose: No congestion/rhinnorhea. Mouth/Throat: Mucous membranes are moist.  Oropharynx non-erythematous. Neck: No stridor.  No cervical spine tenderness to palpation. Hematological/Lymphatic/Immunilogical: No cervical lymphadenopathy. Cardiovascular: Normal rate, regular rhythm. Grossly normal heart sounds.  Good peripheral circulation. Respiratory: Normal respiratory effort.  No retractions. Lungs  CTAB. Gastrointestinal: Soft and nontender. No distention. No abdominal bruits. No CVA tenderness. Musculoskeletal: No deformity. Decreased range of motion with flexion and extension. Patient moderate guarding palpation L3-S1. Patient Straight-leg test. Neurologic:  Normal speech and language. No gross focal neurologic  deficits are appreciated. No gait instability. Skin:  Skin is warm, dry and intact. No rash noted. Psychiatric: Mood and affect are normal. Speech and behavior are normal.  ____________________________________________   LABS (all labs ordered are listed, but only abnormal results are displayed)  Labs Reviewed  URINALYSIS COMPLETEWITH MICROSCOPIC ([facility] ONLY) - Abnormal; Notable for the following:    Color, Urine YELLOW (*)    APPearance HAZY (*)    Nitrite POSITIVE (*)    Leukocytes, UA 1+ (*)    Bacteria, UA MANY (*)    Squamous Epithelial / LPF 0-5 (*)    All other components within normal limits   ____________________________________________  EKG   ____________________________________________  RADIOLOGY   ____________________________________________   PROCEDURES  Procedure(s) performed: None  Procedures  Critical Care performed: No  ____________________________________________   INITIAL IMPRESSION / ASSESSMENT AND PLAN / ED COURSE  Pertinent labs & imaging results that were available during my care of the patient were reviewed by me and considered in my medical decision making (see chart for details).  Radicular low back pain with urinary tract infection. Patient given discharge care instructions. Patient given a prescription for Percocet and Macrobid. Patient advised follow-up with family doctor in one week for reevaluation. ____________________________________________   FINAL CLINICAL IMPRESSION(S) / ED DIAGNOSES  Final diagnoses:  Acute radicular low back pain      NEW MEDICATIONS STARTED DURING THIS VISIT:  New Prescriptions    OXYCODONE-ACETAMINOPHEN (ROXICET) 5-325 MG TABLET    Take 1 tablet by mouth every 4 (four) hours as needed for severe pain.     Note:  This document was prepared using Dragon voice recognition software and may include unintentional dictation errors.    Joni ReiningRonald K Takari Duncombe, PA-C 12/10/15 1056  Governor Rooksebecca Lord, MD 12/10/15 1341

## 2015-12-12 ENCOUNTER — Encounter: Payer: Self-pay | Admitting: *Deleted

## 2015-12-12 ENCOUNTER — Emergency Department
Admission: EM | Admit: 2015-12-12 | Discharge: 2015-12-12 | Disposition: A | Discharge status: Home / Self Care | Payer: BLUE CROSS/BLUE SHIELD | Attending: Emergency Medicine | Admitting: Emergency Medicine

## 2015-12-12 DIAGNOSIS — R519 Headache, unspecified: Secondary | ICD-10-CM

## 2015-12-12 DIAGNOSIS — E119 Type 2 diabetes mellitus without complications: Secondary | ICD-10-CM | POA: Diagnosis not present

## 2015-12-12 DIAGNOSIS — L500 Allergic urticaria: Secondary | ICD-10-CM | POA: Insufficient documentation

## 2015-12-12 DIAGNOSIS — Z87891 Personal history of nicotine dependence: Secondary | ICD-10-CM | POA: Diagnosis not present

## 2015-12-12 DIAGNOSIS — N390 Urinary tract infection, site not specified: Secondary | ICD-10-CM | POA: Diagnosis not present

## 2015-12-12 DIAGNOSIS — Z79899 Other long term (current) drug therapy: Secondary | ICD-10-CM | POA: Insufficient documentation

## 2015-12-12 DIAGNOSIS — I10 Essential (primary) hypertension: Secondary | ICD-10-CM | POA: Diagnosis not present

## 2015-12-12 DIAGNOSIS — R51 Headache: Secondary | ICD-10-CM

## 2015-12-12 MED ORDER — CIPROFLOXACIN HCL 500 MG PO TABS: 500.0000 mg | ORAL_TABLET | Freq: Two times a day (BID) | ORAL | Status: AC

## 2015-12-12 MED ORDER — KETOROLAC TROMETHAMINE 30 MG/ML IJ SOLN
30.0000 mg | Freq: Once | INTRAMUSCULAR | Status: AC
  Administered 2015-12-12: 30 mg via INTRAMUSCULAR

## 2015-12-12 MED ORDER — HYDROXYZINE PAMOATE 25 MG PO CAPS: 25.0000 mg | ORAL_CAPSULE | Freq: Three times a day (TID) | ORAL | Status: AC | PRN

## 2015-12-12 MED ORDER — KETOROLAC TROMETHAMINE 30 MG/ML IJ SOLN
INTRAMUSCULAR | Status: AC
Start: 2015-12-12 — End: 2015-12-12
  Administered 2015-12-12: 30 mg via INTRAMUSCULAR
  Filled 2015-12-12: qty 1

## 2015-12-12 MED ORDER — FAMOTIDINE 20 MG PO TABS
40.0000 mg | ORAL_TABLET | Freq: Once | ORAL | Status: AC
  Administered 2015-12-12: 40 mg via ORAL
  Filled 2015-12-12: qty 2

## 2015-12-12 MED ORDER — DIPHENHYDRAMINE HCL 50 MG/ML IJ SOLN
50.0000 mg | Freq: Once | INTRAMUSCULAR | Status: AC
  Administered 2015-12-12: 50 mg via INTRAVENOUS
  Filled 2015-12-12: qty 1

## 2015-12-12 NOTE — ED Notes (Signed)
See triage note  States she was seen couple of days ago for back pain and UTI  Placed on macrobid for UTI  And developed some itching   States unable to rest  And now has headache  Tried tylenol for headache w/o relief

## 2015-12-12 NOTE — Discharge Instructions (Signed)
Urinary Tract Infection A urinary tract infection (UTI) can occur any place along the urinary tract. The tract includes the kidneys, ureters, bladder, and urethra. A type of germ called bacteria often causes a UTI. UTIs are often helped with antibiotic medicine.  HOME CARE   If given, take antibiotics as told by your doctor. Finish them even if you start to feel better.  Drink enough fluids to keep your pee (urine) clear or pale yellow.  Avoid tea, drinks with caffeine, and bubbly (carbonated) drinks.  Pee often. Avoid holding your pee in for a long time.  Pee before and after having sex (intercourse).  Wipe from front to back after you poop (bowel movement) if you are a woman. Use each tissue only once. GET HELP RIGHT AWAY IF:   You have back pain.  You have lower belly (abdominal) pain.  You have chills.  You feel sick to your stomach (nauseous).  You throw up (vomit).  Your burning or discomfort with peeing does not go away.  You have a fever.  Your symptoms are not better in 3 days. MAKE SURE YOU:   Understand these instructions.  Will watch your condition.  Will get help right away if you are not doing well or get worse.   This information is not intended to replace advice given to you by your health care provider. Make sure you discuss any questions you have with your health care provider.   Document Released: 11/04/2007 Document Revised: 06/08/2014 Document Reviewed: 12/17/2011 Elsevier Interactive Patient Education 2016 Elsevier Inc.   Pruritus  Pruritus is an itching feeling. There are many different conditions and factors that can make your skin itchy. Dry skin is one of the most common causes of itching. Most cases of itching do not require medical attention. Itchy skin can turn into a rash.  HOME CARE INSTRUCTIONS  Watch your pruritus for any changes. Take these steps to help with your condition:  Skin Care  Moisturize your skin as needed. A moisturizer  that contains petroleum jelly is best for keeping moisture in your skin.  Take or apply medicines only as directed by your health care provider. This may include:  Corticosteroid cream.  Anti-itch lotions.  Oral anti-histamines. Apply cool compresses to the affected areas.  Try taking a bath with:  Epsom salts. Follow the instructions on the packaging. You can get these at your local pharmacy or grocery store.  Baking soda. Pour a small amount into the bath as directed by your health care provider.  Colloidal oatmeal. Follow the instructions on the packaging. You can get this at your local pharmacy or grocery store. Try applying baking soda paste to your skin. Stir water into baking soda until it reaches a paste-like consistency.  Do not scratch your skin.  Avoid hot showers or baths, which can make itching worse. A cold shower may help with itching as long as you use a moisturizer after.  Avoid scented soaps, detergents, and perfumes. Use gentle soaps, detergents, perfumes, and other cosmetic products. General Instructions  Avoid wearing tight clothes.  Keep a journal to help track what causes your itch. Write down:  What you eat.  What cosmetic products you use.  What you drink.  What you wear. This includes jewelry. Use a humidifier. This keeps the air moist, which helps to prevent dry skin. SEEK MEDICAL CARE IF:  The itching does not go away after several days.  You sweat at night.  You have weight loss.  You are unusually thirsty.  You urinate more than normal.  You are more tired than normal.  You have abdominal pain.  Your skin tingles.  You feel weak.  Your skin or the whites of your eyes look yellow (jaundice).  Your skin feels numb. This information is not intended to replace advice given to you by your health care provider. Make sure you discuss any questions you have with your health care provider.  Document Released: 01/28/2011 Document Revised: 10/02/2014 Document  Reviewed: 05/14/2014  Elsevier Interactive Patient Education [employer]

## 2015-12-12 NOTE — ED Provider Notes (Signed)
[facility] Emergency Department Provider Note  ____________________________________________  Time seen: Approximately 7:17 AM  I have reviewed the triage vital signs and the nursing notes.   HISTORY  Chief Complaint Pruritis and Headache     HPI Christy Mcbride is a 55 y.o. female , NAD, presents to the emergency department with 2 day history of itching. Patient states she was seen in this emergency department and given 2 injections as well as prescriptions for antibiotics and pain medications for lower back pain and urinary tract infection. Patient states that soon after she got home she had onset of full body itching. Has noted a rash but there is no rash or redness as of today. Patient is uncertain of what is causing her itching. Does have some allergies to medications with worsening sulfa antibiotics but the patient was placed on Macrobid (which is not a sulfa based antibiotic) and given Percocet. Patient states she's had no reaction such as this to Percocet in the past. Also notes that she's had a headache over the last 24 hours. Denies any visual changes, photophobia, phonophobia. The patient attempted to call her primary care provider for follow-up but unfortunately cannot get in with Dr. Marvis Moeller until next week. Denies any chest pain, shortness of breath, numbness, weakness, tingling. Has not had any swelling about the lips/tongue/throat nor any difficulty swallowing. Denies fevers, chills, body aches. Continues with increased urinary frequency but denies dysuria, hematuria, odorous urine, pelvic pain, vaginal discharge, abdominal pain, nausea, vomiting. No culture of the urine was completed at the time of her last visit. States that her back pain is no different. Is in the process of obtaining disability due to her herniated disks and chronic back pain. Has been told that she needs 3 spinal surgeries but unfortunately has not been able to have those surgeries at this time.  Patient is requesting a work note as she was unable to go to work today.   Past Medical History  Diagnosis Date  . Diabetes mellitus without complication (HCC)   . Thyroid disease   . Liver disease   . Herniated cervical disc   . Hypertension   . Allergy   . Sleep apnea     Patient Active Problem List   Diagnosis Date Noted  . Syncope 10/19/2015  . GI bleed 09/13/2015  . DDD (degenerative disc disease), lumbar 03/19/2015  . Lumbar radiculopathy 03/19/2015  . Facet syndrome, lumbar 03/19/2015  . Bilateral occipital neuralgia 03/19/2015    Past Surgical History  Procedure Laterality Date  . Ablation    . Cholecystectomy    . Cesarean section    . Liver biopsy    . Esophagogastroduodenoscopy (egd) with propofol N/A 09/14/2015    Procedure: ESOPHAGOGASTRODUODENOSCOPY (EGD) WITH PROPOFOL;  Surgeon: Wallace Cullens, MD;  Location: [facility] ENDOSCOPY;  Service: Endoscopy;  Laterality: N/A;    Current Outpatient Rx  Name  Route  Sig  Dispense  Refill  . BuPROPion HCl (WELLBUTRIN PO)   Oral   Take 1 tablet by mouth daily.          . ciprofloxacin (CIPRO) 500 MG tablet   Oral   Take 1 tablet (500 mg total) by mouth 2 (two) times daily.   6 tablet   0   . diazepam (VALIUM) 5 MG tablet   Oral   Take 5 mg by mouth every 12 (twelve) hours as needed for anxiety.         . hydrOXYzine (VISTARIL) 25 MG capsule  Oral   Take 1 capsule (25 mg total) by mouth 3 (three) times daily as needed.   21 capsule   0   . insulin detemir (LEVEMIR) 100 UNIT/ML injection   Subcutaneous   Inject 10 Units into the skin at bedtime.         Marland Kitchen. LINZESS 145 MCG CAPS capsule   Oral   Take 145 mcg by mouth daily.      2     Dispense as written.   . nitrofurantoin, macrocrystal-monohydrate, (MACROBID) 100 MG capsule   Oral   Take 1 capsule (100 mg total) by mouth 2 (two) times daily.   20 capsule   0   . ondansetron (ZOFRAN) 4 MG tablet   Oral   Take 1 tablet (4 mg total) by mouth daily  as needed for nausea or vomiting.   20 tablet   1   . oxyCODONE-acetaminophen (ROXICET) 5-325 MG tablet   Oral   Take 1 tablet by mouth every 4 (four) hours as needed for severe pain.   30 tablet   0   . pantoprazole (PROTONIX) 40 MG tablet   Oral   Take 1 tablet (40 mg total) by mouth 2 (two) times daily.   60 tablet   0   . PARoxetine (PAXIL) 40 MG tablet   Oral   Take 40 mg by mouth daily.         Marland Kitchen. rOPINIRole (REQUIP) 1 MG tablet   Oral   Take 1 mg by mouth at bedtime.         . simethicone (GAS-X) 80 MG chewable tablet   Oral   Chew 1 tablet (80 mg total) by mouth every 6 (six) hours as needed for flatulence.   30 tablet   0   . sucralfate (CARAFATE) 1 g tablet   Oral   Take 1 tablet (1 g total) by mouth 4 (four) times daily.   60 tablet   0   . tiZANidine (ZANAFLEX) 4 MG tablet   Oral   Take 4 mg by mouth every 6 (six) hours as needed for muscle spasms.           Allergies Sulfa antibiotics; Fentanyl; Penicillins; and Topiramate  Family History  Problem Relation Age of Onset  . Hypertension Mother   . Stroke Mother   . Diabetes Father   . Heart disease Father     Social History Social History  Substance Use Topics  . Smoking status: Former Smoker    Types: Cigarettes  . Smokeless tobacco: None  . Alcohol Use: 0.0 oz/week    0 Standard drinks or equivalent per week     Comment: occas.     Review of Systems  Constitutional: No fever/chills, fatigue Eyes: No visual changes. No photophobia ENT:  No phonophobia, tinnitus, swelling about lips/tongue/throat, difficulty swallowing Cardiovascular: No chest pain. Respiratory: No cough. No shortness of breath. No wheezing.  Gastrointestinal: No abdominal pain.  No nausea, vomiting.  No diarrhea, constipation. Genitourinary: Positive increased urinary frequency. Negative for malodorous urine, vaginal discharge, pelvic pain, dysuria, hematuria. No urinary hesitancy, urgency. Musculoskeletal:  Positive for chronic back pain.  Skin: Positive itching. Negative for rash, redness, skin sores, open wounds or lesions. Neurological: Positive for headaches, but no focal weakness or numbness. No tingling 10-point ROS otherwise negative.  ____________________________________________   PHYSICAL EXAM:  VITAL SIGNS: ED Triage Vitals  Enc Vitals Group     BP 12/12/15 0658 139/90 mmHg  Pulse Rate 12/12/15 0658 92     Resp 12/12/15 0658 18     Temp 12/12/15 0658 97.8 F (36.6 C)     Temp Source 12/12/15 0658 Oral     SpO2 12/12/15 0658 97 %     Weight 12/12/15 0658 150 lb (68.04 kg)     Height 12/12/15 0658 5\' 2"  (1.575 m)     Head Cir --      Peak Flow --      Pain Score 12/12/15 0659 8     Pain Loc --      Pain Edu? --      Excl. in GC? --      Constitutional: Alert and oriented. Well appearing and in no acute distress. Eyes: Conjunctivae are normal.  Head: Atraumatic. Neck: No stridor. Supple with full range of motion Hematological/Lymphatic/Immunilogical: No cervical lymphadenopathy. Cardiovascular: Normal rate, regular rhythm. Normal S1 and S2. No murmurs, rubs, gallops Good peripheral circulation. Respiratory: Normal respiratory effort without tachypnea or retractions. Lungs CTAB with breath sounds noted in all lung fields. No wheeze, rhonchi, rales.. Neurologic:  Normal speech and language. No gross focal neurologic deficits are appreciated. Gait and posture are normal Skin: Patient actively scratching her left leg during this encounter. Skin is warm, dry and intact. No rash, redness, skin sores, open wounds noted. Psychiatric: Mood and affect are normal. Speech and behavior are normal. Patient exhibits appropriate insight and judgement.   ____________________________________________    LABS  None ____________________________________________  EKG  None ____________________________________________  RADIOLOGY  None ____________________________________________    PROCEDURES  Procedure(s) performed: None      Medications  famotidine (PEPCID) tablet 40 mg (40 mg Oral Given 12/12/15 0733)  diphenhydrAMINE (BENADRYL) injection 50 mg (50 mg Intravenous Given 12/12/15 0734)  ketorolac (TORADOL) 30 MG/ML injection 30 mg (30 mg Intramuscular Given 12/12/15 0734)   ----------------------------------------- 7:52 AM on 12/12/2015 -----------------------------------------  Patient notes that her itching has now subsided and headache has lessened.  ____________________________________________   INITIAL IMPRESSION / ASSESSMENT AND PLAN / ED COURSE  Patient's diagnosis is consistent with allergic urticaria, not intractable headache and UTI without hematuria Patient will be discharged home with prescriptions for Cipro and Vistaril to take as directed. Patient is to follow up with her primary care provider as currently scheduled next week for management of chronic conditions including chronic back pain. She was advised to have repeat urinalysis completed when she follows up with her primary care provider to ensure evidence of infection has resolved. Patient is given ED precautions to return to the ED for any worsening or new symptoms.    ____________________________________________  FINAL CLINICAL IMPRESSION(S) / ED DIAGNOSES  Final diagnoses:  Allergic urticaria  Nonintractable headache, unspecified chronicity pattern, unspecified headache type  Urinary tract infection without hematuria, site unspecified      NEW MEDICATIONS STARTED DURING THIS VISIT:  New Prescriptions   CIPROFLOXACIN (CIPRO) 500 MG TABLET    Take 1 tablet (500 mg total) by mouth 2 (two) times daily.   HYDROXYZINE (VISTARIL) 25 MG CAPSULE    Take 1 capsule (25 mg total) by mouth 3 (three)  times daily as needed.         Hope Pigeon, PA-C 12/12/15 0753  Emily Filbert, MD 12/12/15 [phone]

## 2015-12-12 NOTE — ED Notes (Signed)
States her itching has subsided and headache is easing off.  PA aware

## 2015-12-12 NOTE — ED Notes (Signed)
Pt ambulatory to ED via POV with itching and migraine. Pt states was seen here two nights ago for back pain, given prednisone shot and "something else for pain." Pt states since then itching and full body rash. Pt also states now has HA as well. Pt AAOx4, airway intact, vitals stable. No rash or redness to body noted in triage. Pt sating 97% on RA.

## 2016-01-20 ENCOUNTER — Telehealth: Payer: Self-pay | Admitting: *Deleted

## 2016-01-22 ENCOUNTER — Telehealth: Payer: Self-pay | Admitting: Pain Medicine

## 2016-01-22 NOTE — Telephone Encounter (Signed)
Patient lvm 01-22-16 at 3:10stating she could not make appt for 01-23-16 and she would call to resched when she could

## 2016-01-22 NOTE — Telephone Encounter (Signed)
Thank you :)

## 2016-01-23 ENCOUNTER — Ambulatory Visit: Payer: BLUE CROSS/BLUE SHIELD | Admitting: Pain Medicine

## 2016-01-25 ENCOUNTER — Encounter: Payer: Self-pay | Admitting: Emergency Medicine

## 2016-01-25 ENCOUNTER — Emergency Department
Admission: EM | Admit: 2016-01-25 | Discharge: 2016-01-25 | Disposition: A | Discharge status: Home / Self Care | Payer: BLUE CROSS/BLUE SHIELD | Attending: Emergency Medicine | Admitting: Emergency Medicine

## 2016-01-25 DIAGNOSIS — M549 Dorsalgia, unspecified: Secondary | ICD-10-CM | POA: Insufficient documentation

## 2016-01-25 DIAGNOSIS — E119 Type 2 diabetes mellitus without complications: Secondary | ICD-10-CM | POA: Insufficient documentation

## 2016-01-25 DIAGNOSIS — G8929 Other chronic pain: Secondary | ICD-10-CM | POA: Diagnosis not present

## 2016-01-25 DIAGNOSIS — Z791 Long term (current) use of non-steroidal anti-inflammatories (NSAID): Secondary | ICD-10-CM | POA: Insufficient documentation

## 2016-01-25 DIAGNOSIS — Z79899 Other long term (current) drug therapy: Secondary | ICD-10-CM | POA: Insufficient documentation

## 2016-01-25 DIAGNOSIS — I10 Essential (primary) hypertension: Secondary | ICD-10-CM | POA: Diagnosis not present

## 2016-01-25 DIAGNOSIS — Z87891 Personal history of nicotine dependence: Secondary | ICD-10-CM | POA: Insufficient documentation

## 2016-01-25 DIAGNOSIS — M5442 Lumbago with sciatica, left side: Secondary | ICD-10-CM | POA: Diagnosis not present

## 2016-01-25 DIAGNOSIS — M545 Low back pain: Secondary | ICD-10-CM | POA: Diagnosis present

## 2016-01-25 LAB — URINALYSIS COMPLETE WITH MICROSCOPIC (ARMC ONLY)
Bacteria, UA: NONE SEEN
Bilirubin Urine: NEGATIVE
Glucose, UA: NEGATIVE mg/dL
Hgb urine dipstick: NEGATIVE
Ketones, ur: NEGATIVE mg/dL
Leukocytes, UA: NEGATIVE
Nitrite: NEGATIVE
Protein, ur: NEGATIVE mg/dL
Specific Gravity, Urine: 1.013 (ref 1.005–1.030)
pH: 6 (ref 5.0–8.0)

## 2016-01-25 MED ORDER — ETODOLAC 400 MG PO TABS: 400.0000 mg | ORAL_TABLET | Freq: Two times a day (BID) | ORAL | 0 refills | Status: AC

## 2016-01-25 NOTE — ED Triage Notes (Addendum)
States not sure if its her back or a uti. Pain is in the mid back (bra strap area) that she says goes down into her left hip

## 2016-01-25 NOTE — ED Triage Notes (Signed)
Took aleve and diazepam and her usual meds at 8am

## 2016-01-25 NOTE — Discharge Instructions (Signed)
Keep your appointment with Dr. Metta Clinesrisp. Begin etodolac twice a day with food for back pain. You may also use ice or heat to your back for comfort measures. You may also follow-up with your primary care doctor if any other medications as needed until you can see your back doctor.

## 2016-01-25 NOTE — ED Provider Notes (Signed)
[facility] Emergency Department Provider Note   ____________________________________________   First MD Initiated Contact with Patient 01/25/16 1305     (approximate)  I have reviewed the triage vital signs and the nursing notes.   HISTORY  Chief Complaint Back Pain    HPI Christy Mcbride is a 55 y.o. female is here complaint of left sided back pain that goes down into her left hip. Patient states that she has taken Aleve and diazepam at 8 AM this morning without any relief. Patient has had a history of urinary tract infections and is not sure if this is why her back is hurting. She denies any hematuria, nausea, vomiting, fever or chills. Patient is unaware of any recent injury to her back. Patient does admit that she has chronic back pain. Denies any paresthesias, incontinence of bowel or bladder symptoms her back began hurting. She rates her pain as a 9/10. She continues to be ambulatory in the emergency room.   Past Medical History:  Diagnosis Date  . Allergy   . Diabetes mellitus without complication (HCC)   . Herniated cervical disc   . Hypertension   . Liver disease   . Sleep apnea   . Thyroid disease     Patient Active Problem List   Diagnosis Date Noted  . Syncope 10/19/2015  . GI bleed 09/13/2015  . DDD (degenerative disc disease), lumbar 03/19/2015  . Lumbar radiculopathy 03/19/2015  . Facet syndrome, lumbar 03/19/2015  . Bilateral occipital neuralgia 03/19/2015    Past Surgical History:  Procedure Laterality Date  . ABLATION    . CESAREAN SECTION    . CHOLECYSTECTOMY    . ESOPHAGOGASTRODUODENOSCOPY (EGD) WITH PROPOFOL N/A 09/14/2015   Procedure: ESOPHAGOGASTRODUODENOSCOPY (EGD) WITH PROPOFOL;  Surgeon: Wallace CullensPaul Y Oh, MD;  Location: [facility] ENDOSCOPY;  Service: Endoscopy;  Laterality: N/A;  . LIVER BIOPSY      Prior to Admission medications   Medication Sig Start Date End Date Taking? Authorizing Provider  BuPROPion HCl (WELLBUTRIN PO)  Take 1 tablet by mouth daily.     Historical Provider, MD  diazepam (VALIUM) 5 MG tablet Take 5 mg by mouth every 12 (twelve) hours as needed for anxiety.    Historical Provider, MD  etodolac (LODINE) 400 MG tablet Take 1 tablet (400 mg total) by mouth 2 (two) times daily. 01/25/16   Tommi Rumpshonda L Nyleah Mcginnis, PA-C  hydrOXYzine (VISTARIL) 25 MG capsule Take 1 capsule (25 mg total) by mouth 3 (three) times daily as needed. 12/12/15   Jami L Hagler, PA-C  insulin detemir (LEVEMIR) 100 UNIT/ML injection Inject 10 Units into the skin at bedtime.    Historical Provider, MD  LINZESS 145 MCG CAPS capsule Take 145 mcg by mouth daily. 10/01/15   Historical Provider, MD  nitrofurantoin, macrocrystal-monohydrate, (MACROBID) 100 MG capsule Take 1 capsule (100 mg total) by mouth 2 (two) times daily. 12/10/15   Joni Reiningonald K Smith, PA-C  ondansetron (ZOFRAN) 4 MG tablet Take 1 tablet (4 mg total) by mouth daily as needed for nausea or vomiting. 11/14/15   Jene Everyobert Kinner, MD  oxyCODONE-acetaminophen (ROXICET) 5-325 MG tablet Take 1 tablet by mouth every 4 (four) hours as needed for severe pain. 12/10/15   Joni Reiningonald K Smith, PA-C  pantoprazole (PROTONIX) 40 MG tablet Take 1 tablet (40 mg total) by mouth 2 (two) times daily. 09/14/15   Wyatt Hasteavid K Hower, MD  PARoxetine (PAXIL) 40 MG tablet Take 40 mg by mouth daily.    Historical Provider, MD  rOPINIRole (  REQUIP) 1 MG tablet Take 1 mg by mouth at bedtime.    Historical Provider, MD  simethicone (GAS-X) 80 MG chewable tablet Chew 1 tablet (80 mg total) by mouth every 6 (six) hours as needed for flatulence. 09/14/15   Wyatt Haste, MD  sucralfate (CARAFATE) 1 g tablet Take 1 tablet (1 g total) by mouth 4 (four) times daily. 11/14/15 11/13/16  Jene Every, MD  tiZANidine (ZANAFLEX) 4 MG tablet Take 4 mg by mouth every 6 (six) hours as needed for muscle spasms.    Historical Provider, MD    Allergies Sulfa antibiotics; Fentanyl; Penicillins; and Topiramate  Family History  Problem Relation Age  of Onset  . Hypertension Mother   . Stroke Mother   . Diabetes Father   . Heart disease Father     Social History Social History  Substance Use Topics  . Smoking status: Former Smoker    Types: Cigarettes  . Smokeless tobacco: Not on file  . Alcohol use 0.0 oz/week     Comment: occas.    Review of Systems Constitutional: No fever/chills Eyes: No visual changes. ENT: No sore throat. Cardiovascular: Denies chest pain. Respiratory: Denies shortness of breath. Gastrointestinal: No abdominal pain.  No nausea, no vomiting.  No diarrhea.  No constipation. Genitourinary: Negative for dysuria. Musculoskeletal: Positive for left-sided back pain. Skin: Negative for rash. Neurological: Negative for headaches, focal weakness or numbness.  10-point ROS otherwise negative.  ____________________________________________   PHYSICAL EXAM:  VITAL SIGNS: ED Triage Vitals  Enc Vitals Group     BP 01/25/16 1208 (!) 108/52     Pulse Rate 01/25/16 1208 60     Resp 01/25/16 1208 18     Temp 01/25/16 1208 97.7 F (36.5 C)     Temp Source 01/25/16 1208 Oral     SpO2 01/25/16 1208 99 %     Weight 01/25/16 1209 155 lb (70.3 kg)     Height 01/25/16 1209 5\' 2"  (1.575 m)     Head Circumference --      Peak Flow --      Pain Score 01/25/16 1210 9     Pain Loc --      Pain Edu? --      Excl. in GC? --     Constitutional: Alert and oriented. Well appearing and in no acute distress. Eyes: Conjunctivae are normal. PERRL. EOMI. Head: Atraumatic. Nose: No congestion/rhinnorhea. Neck: No stridor.   Cardiovascular: Normal rate, regular rhythm. Grossly normal heart sounds.  Good peripheral circulation. Respiratory: Normal respiratory effort.  No retractions. Lungs CTAB. Gastrointestinal: Soft and nontender. No distention. No CVA tenderness. Musculoskeletal: On examination of the back there is no gross deformity. Range of motion is restricted secondary patient's discomfort. No active muscle  spasms were seen. Straight leg raises were approximately 70 without discomfort. Reflexes were 2+ bilaterally. Gait was noted. Neurologic:  Normal speech and language. No gross focal neurologic deficits are appreciated. No gait instability. Skin:  Skin is warm, dry and intact. No rash noted. Psychiatric: Mood and affect are normal. Speech and behavior are normal.  ____________________________________________   LABS (all labs ordered are listed, but only abnormal results are displayed)  Labs Reviewed  URINALYSIS COMPLETEWITH MICROSCOPIC ([facility] ONLY) - Abnormal; Notable for the following:       Result Value   Color, Urine YELLOW (*)    APPearance HAZY (*)    Squamous Epithelial / LPF 6-30 (*)    All other components within normal limits  PROCEDURES  Procedure(s) performed: None  Procedures  Critical Care performed: No  ____________________________________________   INITIAL IMPRESSION / ASSESSMENT AND PLAN / ED COURSE  Pertinent labs & imaging results that were available during my care of the patient were reviewed by me and considered in my medical decision making (see chart for details).    Clinical Course   Patient was informed that she is not having urinary tract infection. Patient was given a prescription for etodolac 400 mg twice a day. Patient is to contact her pain clinic doctor. She is also encouraged to use ice or heat to her back as needed for discomfort. She is also to continue her medication at home.  ____________________________________________   FINAL CLINICAL IMPRESSION(S) / ED DIAGNOSES  Final diagnoses:  Acute left-sided low back pain with left-sided sciatica  Chronic back pain      NEW MEDICATIONS STARTED DURING THIS VISIT:  Discharge Medication List as of 01/25/2016  1:17 PM    START taking these medications   Details  etodolac (LODINE) 400 MG tablet Take 1 tablet (400 mg total) by mouth 2 (two) times daily., Starting Sat 01/25/2016, Print          Note:  This document was prepared using Dragon voice recognition software and may include unintentional dictation errors.    Tommi Rumps, PA-C 01/25/16 1719    Myrna Blazer, MD 01/30/16 2107

## 2016-03-01 DEATH — deceased

## 2016-07-25 IMAGING — CR DG ELBOW COMPLETE 3+V*R*
4 series · 4 of 4 positions shown · non-contrast
Comparison: None.

CLINICAL DATA: One week history of pain.  No history of trauma.

EXAM:
RIGHT ELBOW - COMPLETE 3+ VIEW

[elbow ap]
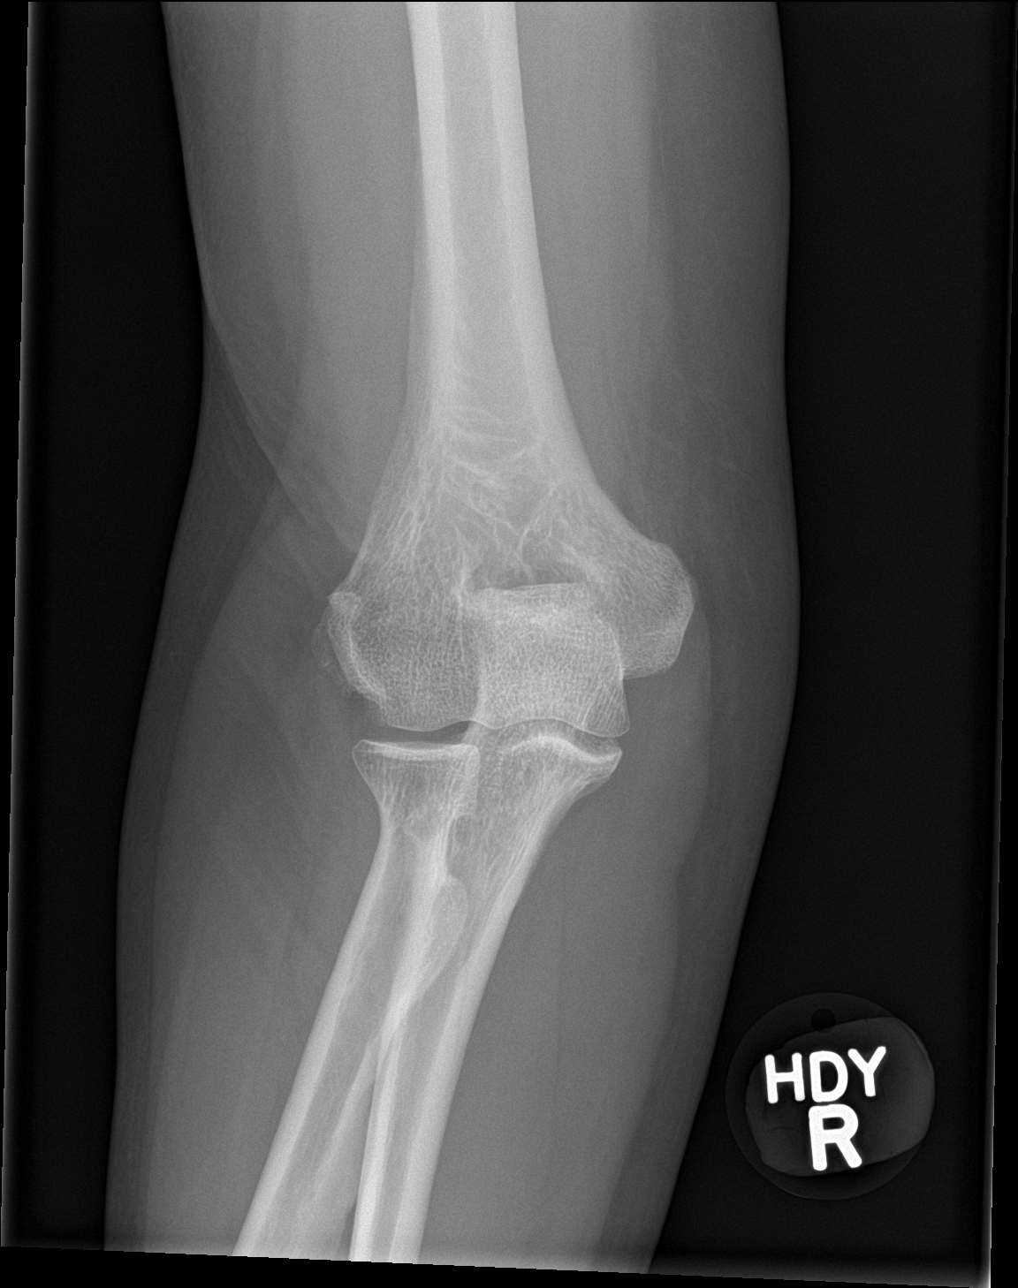

[elbow obl (1 of 2)]
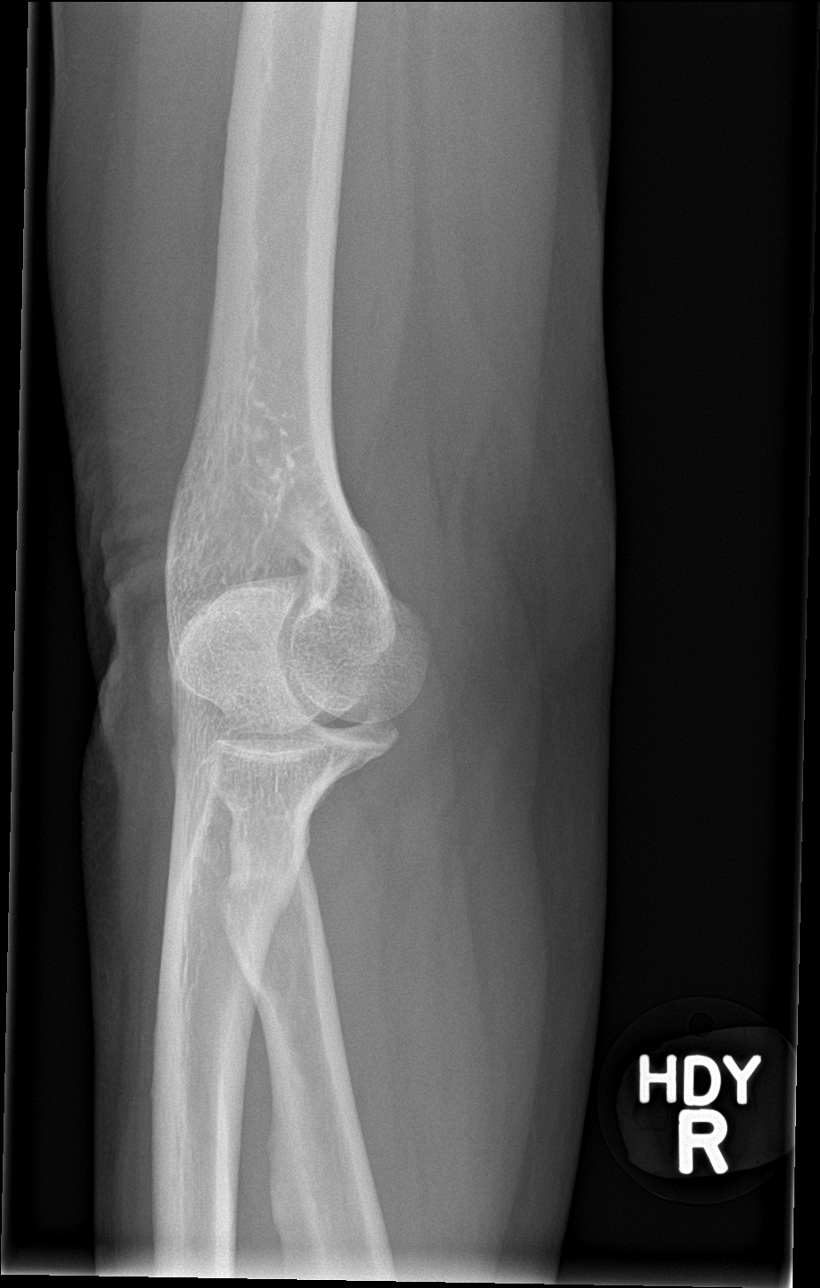

[elbow obl (2 of 2)]
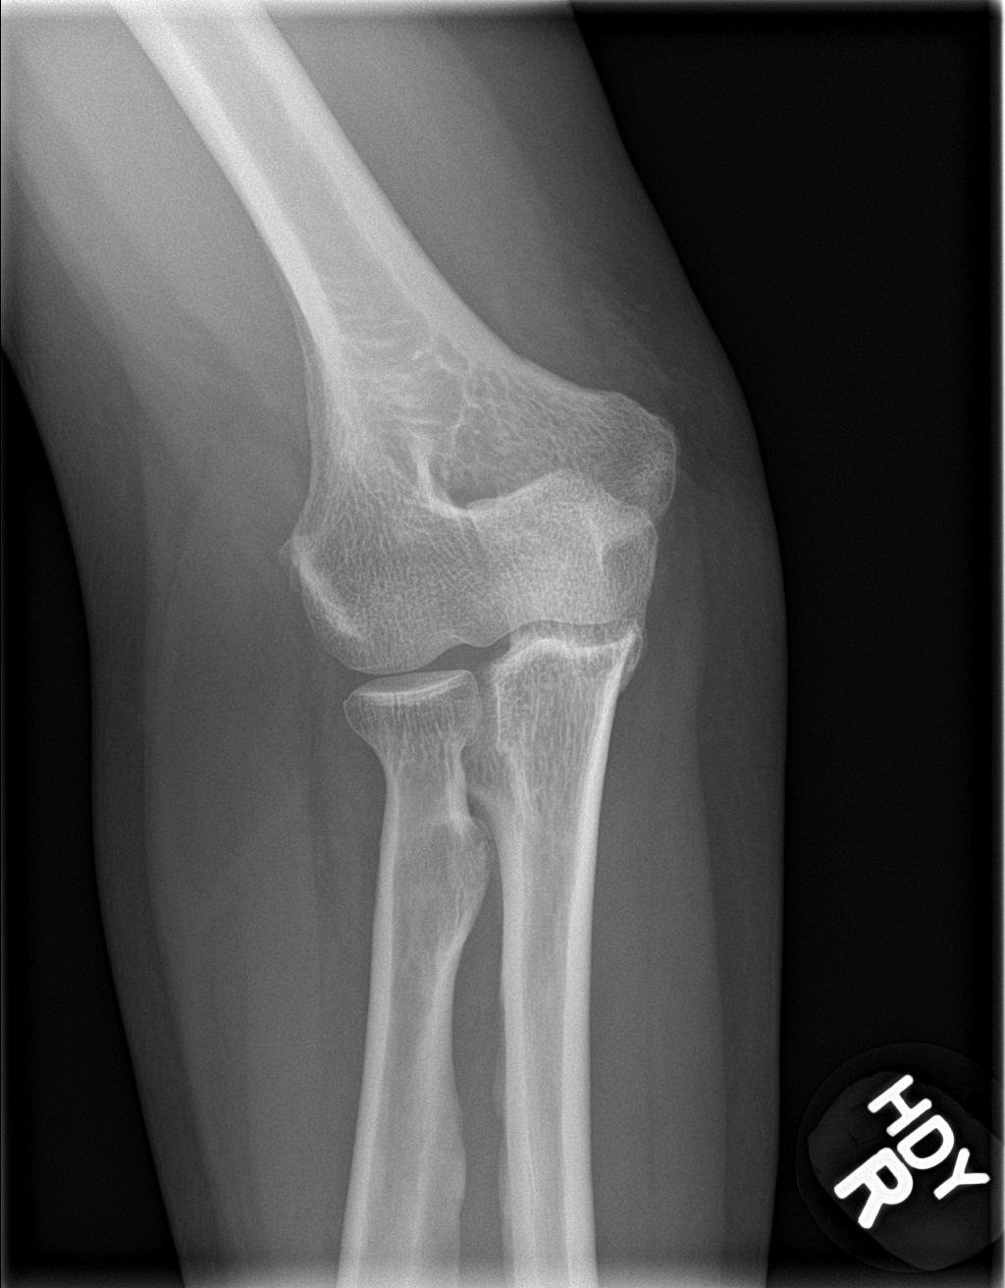

[elbow lat]
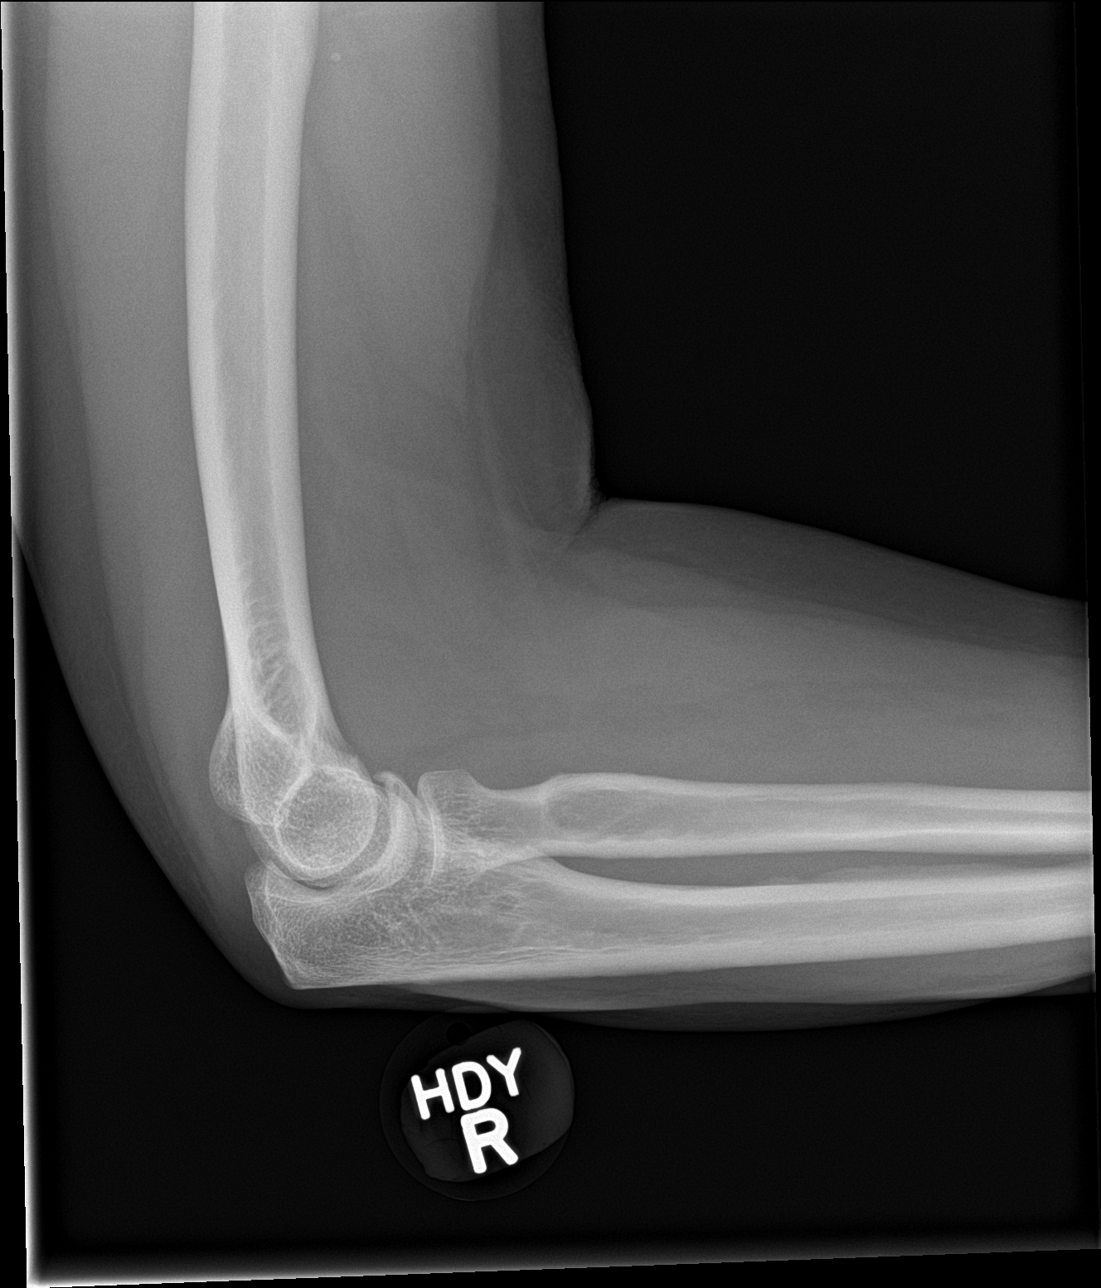

[4 of 4 positions shown; findings below may reference images not displayed]

FINDINGS: Frontal, lateral, and bilateral oblique views were obtained. There
is no fracture or dislocation. There is no appreciable joint
effusion. The joint spaces appear intact. No erosive change.
IMPRESSION: No fracture or dislocation.  No appreciable arthropathy.

## 2017-02-04 IMAGING — CR DG ABDOMEN ACUTE W/ 1V CHEST
1 series · 4 of 4 positions shown · non-contrast
Comparison: CT of the abdomen and pelvis from 09/03/2015, and chest
radiograph performed 10/10/2014

CLINICAL DATA: Subacute onset of generalized abdominal pain and
swelling. Hematemesis. Initial encounter.

EXAM:
DG ABDOMEN ACUTE W/ 1V CHEST

[Series 1: dg abd acute w/chest · 0.14mm/px · 4 of 4 slices shown]
[im 1/4]
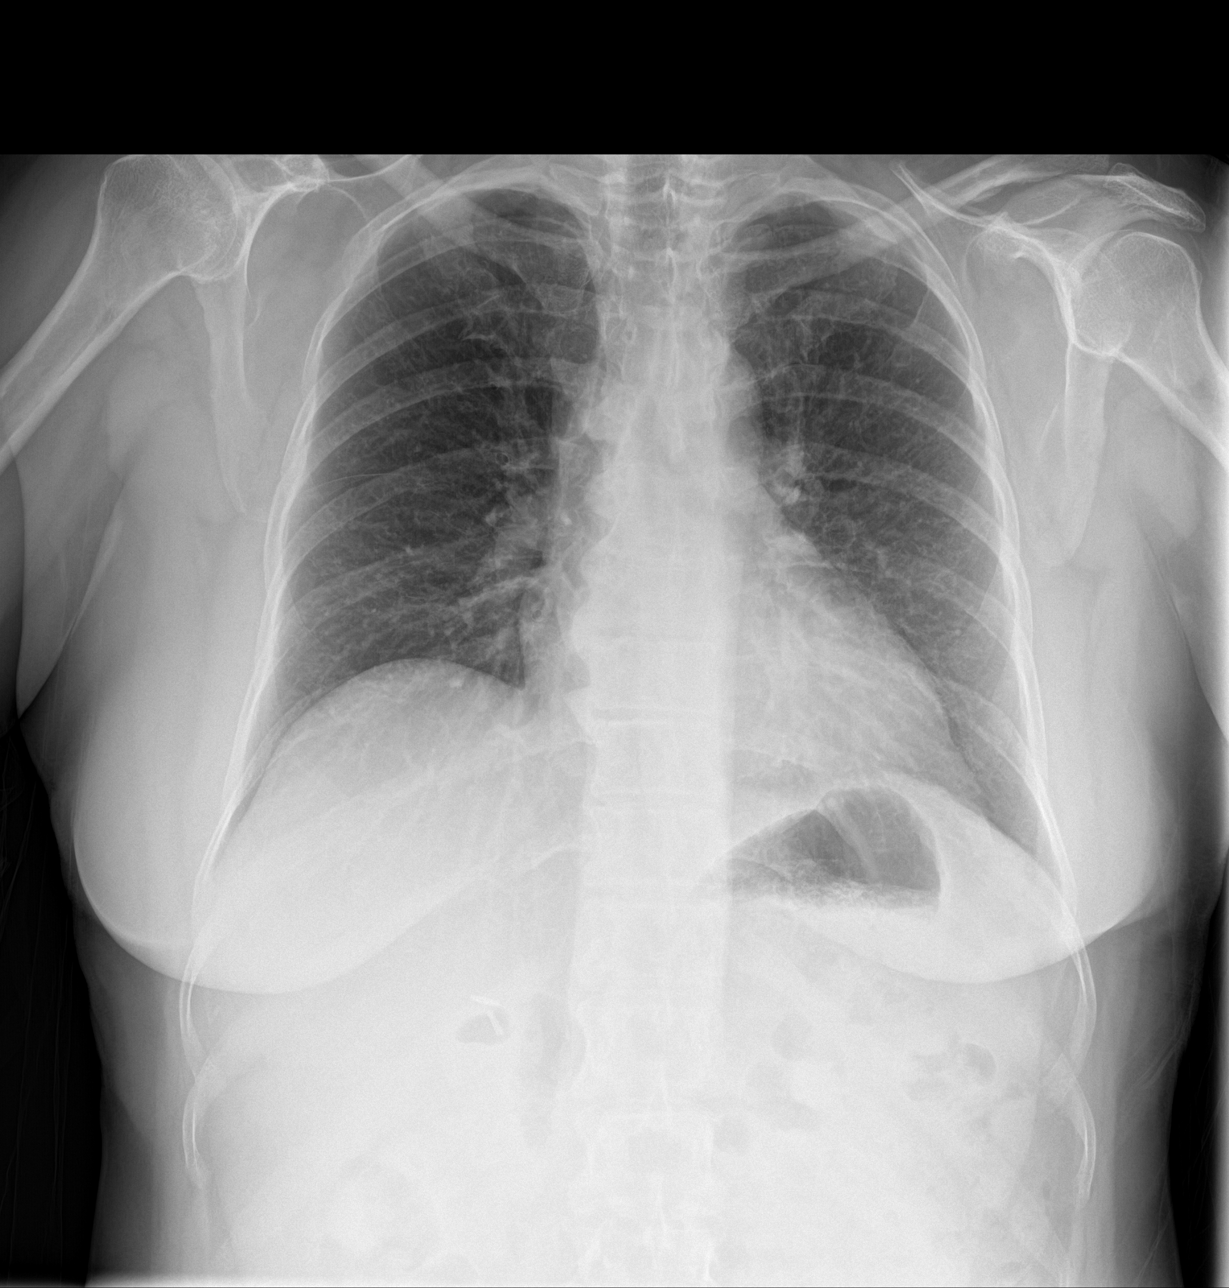
[im 2/4]
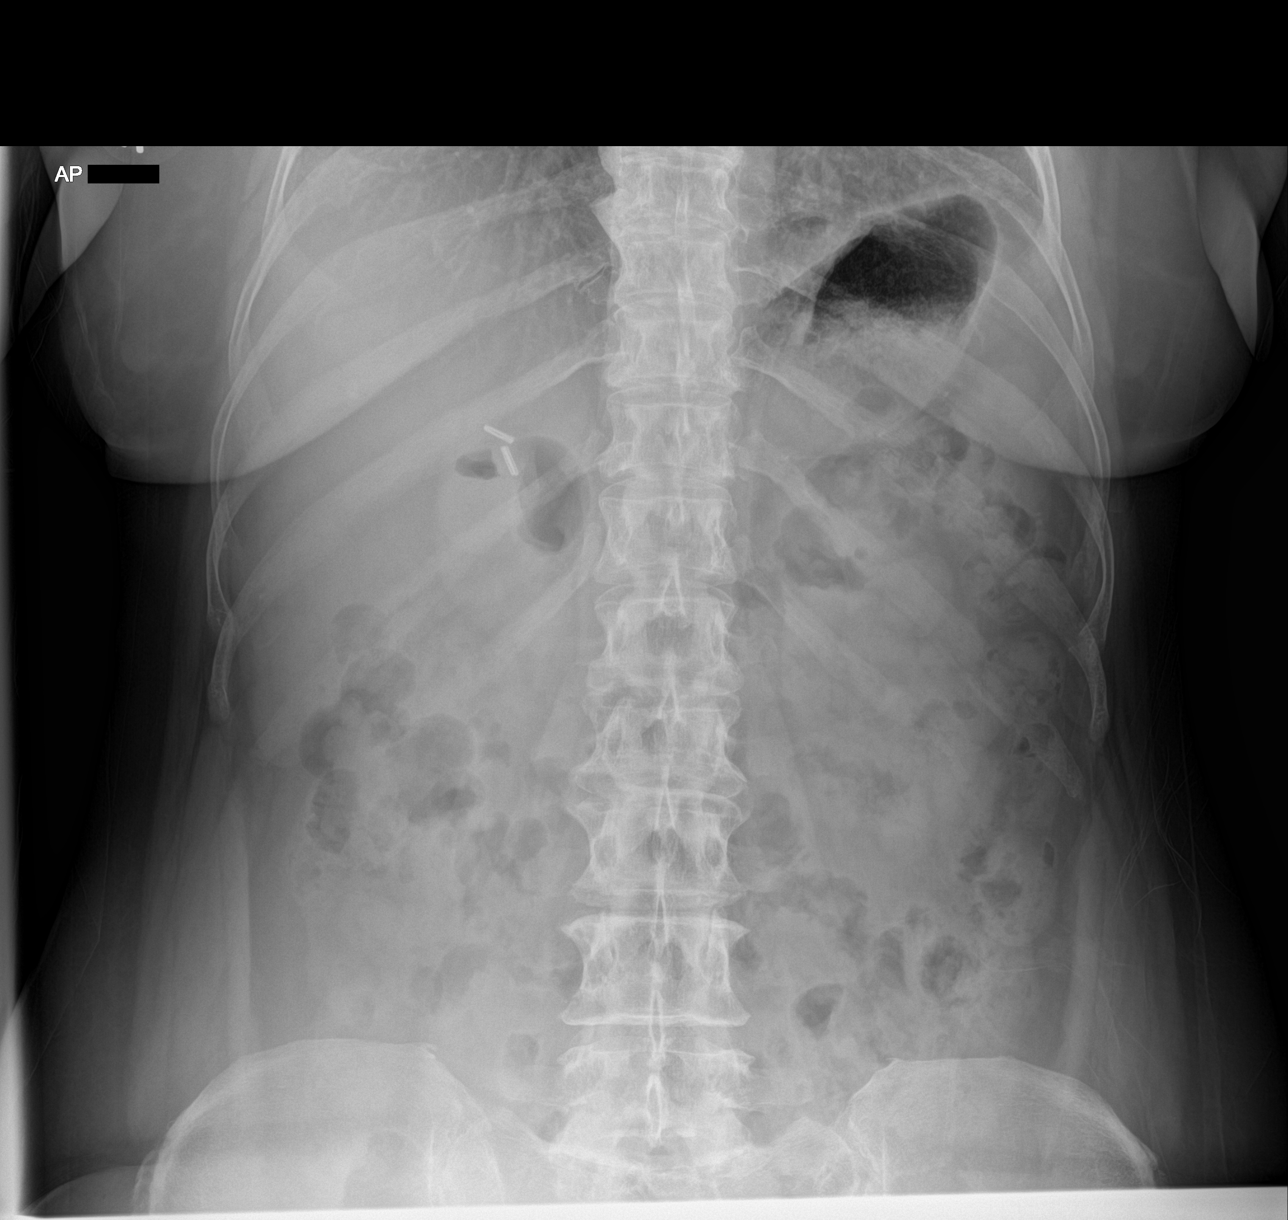
[im 3/4]
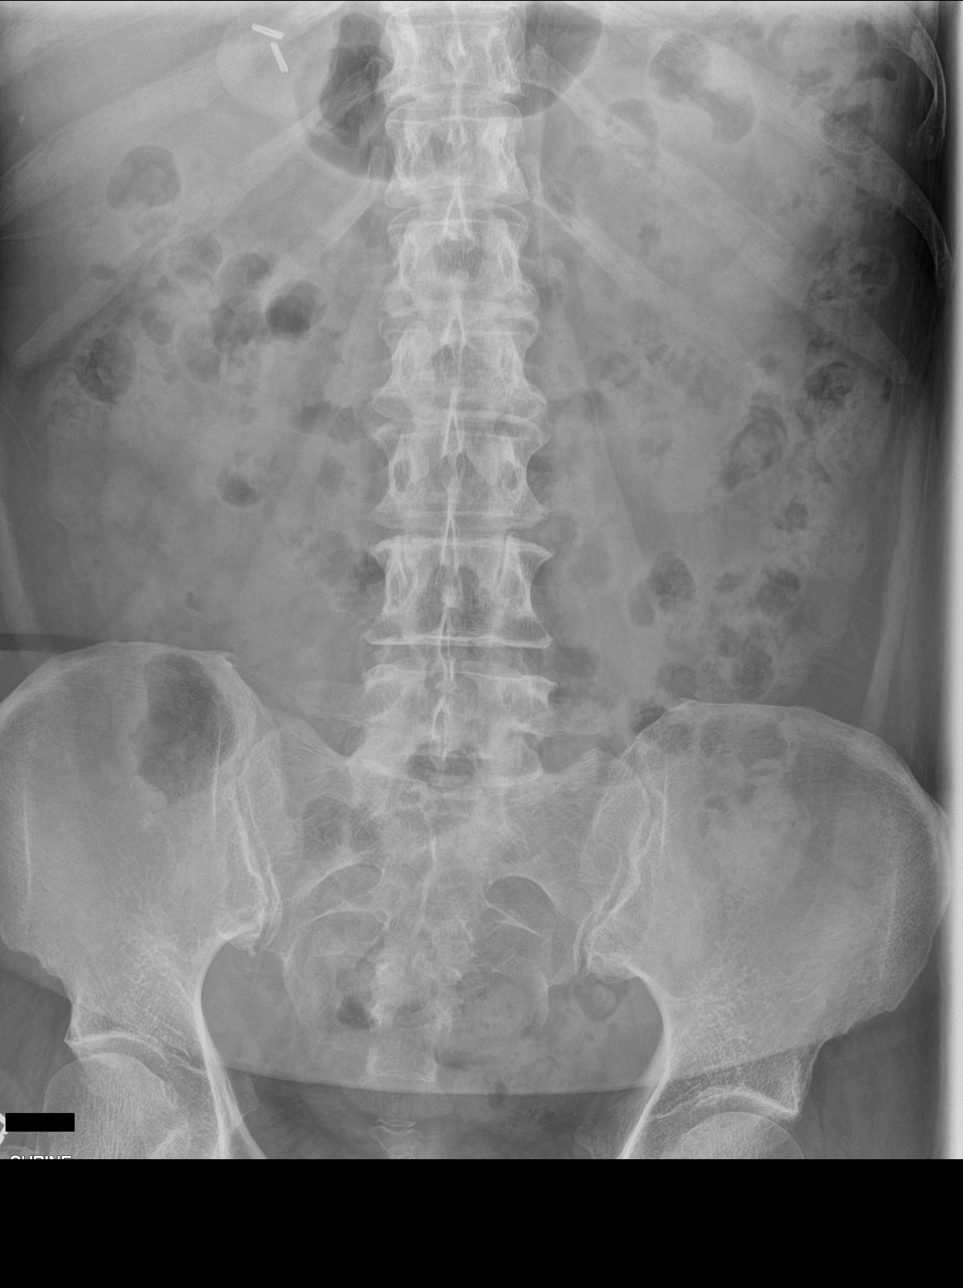
[im 4/4]
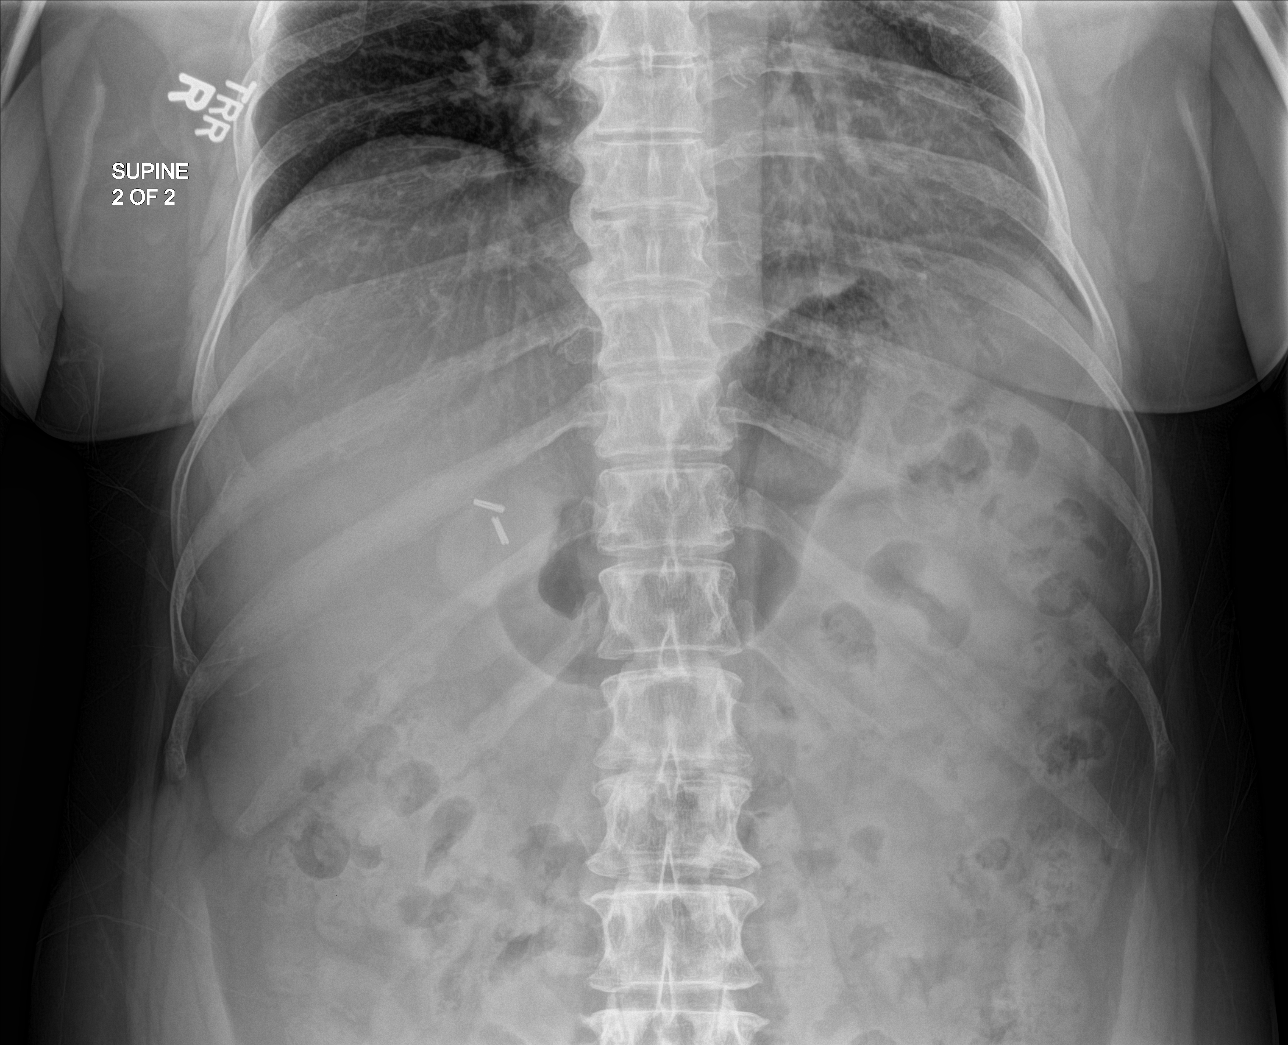

[4 of 4 positions shown; findings below may reference images not displayed]

FINDINGS: The lungs are well-aerated. Mild peribronchial thickening is noted.
There is no evidence of focal opacification, pleural effusion or
pneumothorax. The cardiomediastinal silhouette is borderline normal
in size.

The visualized bowel gas pattern is unremarkable. Scattered stool
and air are seen within the colon; there is no evidence of small
bowel dilatation to suggest obstruction. No free intra-abdominal air
is identified on the provided upright view. Clips are noted within
the right upper quadrant, reflecting prior cholecystectomy.

No acute osseous abnormalities are seen; the sacroiliac joints are
unremarkable in appearance.
IMPRESSION: 1. Unremarkable bowel gas pattern; no free intra-abdominal air seen.
Moderate amount of stool noted in the colon.
2. Mild peribronchial thickening noted.  Lungs otherwise clear.
